# Patient Record
Sex: Male | Born: 1955 | Race: White | Hispanic: No | Smoking: Current some day smoker
Health system: Southern US, Community
[De-identification: ages and names within clinical notes are randomized; demographics above are authoritative.]

## PROBLEM LIST (undated history)

## (undated) DIAGNOSIS — Z973 Presence of spectacles and contact lenses: Secondary | ICD-10-CM

## (undated) DIAGNOSIS — B001 Herpesviral vesicular dermatitis: Secondary | ICD-10-CM

## (undated) DIAGNOSIS — N529 Male erectile dysfunction, unspecified: Secondary | ICD-10-CM

## (undated) DIAGNOSIS — C61 Malignant neoplasm of prostate: Secondary | ICD-10-CM

## (undated) HISTORY — PX: TONSILLECTOMY: SUR1361

## (undated) HISTORY — PX: HIGH INTENSITY FOCUSED ULTRASOUND (HIFU) OF THE PROSTATE: SHX6793

## (undated) HISTORY — PX: OTHER SURGICAL HISTORY: SHX169

---

## 2010-12-12 ENCOUNTER — Encounter: Payer: Self-pay | Admitting: Student

## 2010-12-12 ENCOUNTER — Emergency Department (HOSPITAL_BASED_OUTPATIENT_CLINIC_OR_DEPARTMENT_OTHER)
Admission: EM | Admit: 2010-12-12 | Discharge: 2010-12-12 | Disposition: A | Discharge status: Home / Self Care | Payer: 59 | Attending: Emergency Medicine | Admitting: Emergency Medicine

## 2010-12-12 DIAGNOSIS — J029 Acute pharyngitis, unspecified: Secondary | ICD-10-CM | POA: Insufficient documentation

## 2010-12-12 DIAGNOSIS — F17200 Nicotine dependence, unspecified, uncomplicated: Secondary | ICD-10-CM | POA: Insufficient documentation

## 2010-12-12 LAB — RAPID STREP SCREEN (MED CTR MEBANE ONLY): Streptococcus, Group A Screen (Direct): NEGATIVE

## 2010-12-12 MED ORDER — AZITHROMYCIN 250 MG PO TABS: 250.0000 mg | ORAL_TABLET | Freq: Every day | ORAL | Status: AC

## 2010-12-12 MED ORDER — MELOXICAM 7.5 MG PO TABS: 7.5000 mg | ORAL_TABLET | Freq: Every day | ORAL | Status: AC

## 2010-12-12 NOTE — ED Provider Notes (Signed)
History     Chief Complaint  Patient presents with  . Sore Throat   HPI Comments: Recent exposure to strep.  Now with sore throat, fever, congestion.  Worse with swallowing.    Patient is a 55 y.o. male presenting with pharyngitis. The history is provided by the patient.  Sore Throat This is a new problem. Pertinent negatives include no chest pain and no shortness of breath.    History reviewed. No pertinent past medical history.  History reviewed. No pertinent past surgical history.  History reviewed. No pertinent family history.  History  Substance Use Topics  . Smoking status: Current Some Day Smoker  . Smokeless tobacco: Never Used  . Alcohol Use: Yes      Review of Systems  Constitutional: Positive for fever. Negative for chills.  HENT:       As above.  Respiratory: Negative for cough and shortness of breath.   Cardiovascular: Negative for chest pain and palpitations.  All other systems reviewed and are negative.    Physical Exam  BP 137/85  Pulse 95  Temp(Src) 99.7 F (37.6 C) (Oral)  Resp 20  Wt 185 lb (83.915 kg)  SpO2 98%  Physical Exam  Constitutional: He appears well-developed and well-nourished. No distress.  HENT:  Head: Normocephalic and atraumatic.  Mouth/Throat: No oropharyngeal exudate.       PO erythematous.  Neck: Normal range of motion. Neck supple.  Cardiovascular: Normal rate and regular rhythm.  Exam reveals no gallop and no friction rub.   No murmur heard. Pulmonary/Chest: Effort normal and breath sounds normal. No respiratory distress. He has no wheezes.  Abdominal: Soft.  Lymphadenopathy:    He has no cervical adenopathy.  Skin: He is not diaphoretic.    ED Course  Procedures  MDM Will treat with antibiotics, possibly strep or sinus infection.      Brian Clayton 12/12/10 2250

## 2010-12-12 NOTE — ED Notes (Signed)
Pt in with c/o sore throat and a lot of phlegm in back of throat

## 2010-12-12 NOTE — ED Notes (Signed)
Pt also reports right tennis elbow s/p playing tennis today,

## 2013-12-18 HISTORY — PX: COLONOSCOPY: SHX174

## 2015-03-30 ENCOUNTER — Ambulatory Visit (INDEPENDENT_AMBULATORY_CARE_PROVIDER_SITE_OTHER): Payer: Self-pay | Admitting: Family Medicine

## 2015-03-30 VITALS — BP 140/80 | HR 56 | Temp 98.3°F | Resp 18 | Ht 73.0 in | Wt 197.2 lb

## 2015-03-30 DIAGNOSIS — Z021 Encounter for pre-employment examination: Secondary | ICD-10-CM

## 2015-03-30 DIAGNOSIS — Z0289 Encounter for other administrative examinations: Secondary | ICD-10-CM

## 2015-03-30 NOTE — Progress Notes (Signed)
      Chief Complaint:  Chief Complaint  Patient presents with  . Annual Exam    DOT    HPI: Brian Clayton is a 59 y.o. male who reports to [facility] today complaining of DOT PE  He has no complaints He denies any  history of HTN/MI/OSA/DM; denies CP/SOB/palpitations He denies having SI/HI/hallucinations He smokes some He has peformance anxiety so take propranolol prn    History reviewed. No pertinent past medical history. Past Surgical History  Procedure Laterality Date  . Tonsillectomy     Social History   Social History  . Marital Status: Married    Spouse Name: N/A  . Number of Children: N/A  . Years of Education: N/A   Social History Main Topics  . Smoking status: Current Some Day Smoker  . Smokeless tobacco: Never Used  . Alcohol Use: Yes  . Drug Use: No  . Sexual Activity: Not Asked   Other Topics Concern  . None   Social History Narrative   History reviewed. No pertinent family history. No Known Allergies Prior to Admission medications   Medication Sig Start Date End Date Taking? Authorizing Provider  propranolol (INDERAL) 10 MG tablet Take 10 mg by mouth 3 (three) times daily.   Yes Historical Provider, MD     ROS: The patient denies fevers, chills, night sweats, unintentional weight loss, chest pain, palpitations, wheezing, dyspnea on exertion, nausea, vomiting, abdominal pain, dysuria, hematuria, melena, numbness, weakness, or tingling.   All other systems have been reviewed and were otherwise negative with the exception of those mentioned in the HPI and as above.    PHYSICAL EXAM: Filed Vitals:   03/30/15 1545  BP: 140/80  Pulse: 56  Temp: 98.3 F (36.8 C)  Resp: 18   Body mass index is 26.02 kg/(m^2).   General: Alert, no acute distress HEENT:  Normocephalic, atraumatic, oropharynx patent. EOMI, PERRLA, fundo nl Cardiovascular:  Regular rate and rhythm, no rubs murmurs or gallops.  No Carotid bruits, radial pulse intact. No pedal edema.    Respiratory: Clear to auscultation bilaterally.  No wheezes, rales, or rhonchi.  No cyanosis, no use of accessory musculature Abdominal: No organomegaly, abdomen is soft and non-tender, positive bowel sounds. No masses. Skin: No rashes. Neurologic: Facial musculature symmetric. Psychiatric: Patient acts appropriately throughout our interaction. Lymphatic: No cervical or submandibular lymphadenopathy Musculoskeletal: Gait intact. No edema, tenderness 5/5 strength   LABS: Results for orders placed or performed during the hospital encounter of 12/12/10  Rapid strep screen  Result Value Ref Range   Streptococcus, Group A Screen (Direct) NEGATIVE NEGATIVE     EKG/XRAY:   Primary read interpreted by Dr. Marin Comment at [facility].   ASSESSMENT/PLAN: Encounter Diagnosis  Name Primary?  . 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 Patient is aware that all medications have potential sideeffects and we are unable to predict every sideeffect or drug-drug interaction that may occur.    DO  03/30/2015 4:38 PM

## 2015-10-02 DIAGNOSIS — Z0000 Encounter for general adult medical examination without abnormal findings: Secondary | ICD-10-CM | POA: Diagnosis not present

## 2015-10-02 DIAGNOSIS — Z13220 Encounter for screening for lipoid disorders: Secondary | ICD-10-CM | POA: Diagnosis not present

## 2015-10-02 DIAGNOSIS — Z125 Encounter for screening for malignant neoplasm of prostate: Secondary | ICD-10-CM | POA: Diagnosis not present

## 2015-11-24 DIAGNOSIS — J069 Acute upper respiratory infection, unspecified: Secondary | ICD-10-CM | POA: Diagnosis not present

## 2015-11-29 DIAGNOSIS — R972 Elevated prostate specific antigen [PSA]: Secondary | ICD-10-CM | POA: Diagnosis not present

## 2015-12-11 DIAGNOSIS — C61 Malignant neoplasm of prostate: Secondary | ICD-10-CM | POA: Diagnosis not present

## 2015-12-11 DIAGNOSIS — R972 Elevated prostate specific antigen [PSA]: Secondary | ICD-10-CM | POA: Diagnosis not present

## 2015-12-11 HISTORY — PX: PROSTATE BIOPSY: SHX241

## 2015-12-20 DIAGNOSIS — R972 Elevated prostate specific antigen [PSA]: Secondary | ICD-10-CM | POA: Diagnosis not present

## 2015-12-20 DIAGNOSIS — C61 Malignant neoplasm of prostate: Secondary | ICD-10-CM | POA: Diagnosis not present

## 2016-01-02 DIAGNOSIS — C61 Malignant neoplasm of prostate: Secondary | ICD-10-CM | POA: Diagnosis not present

## 2016-01-11 NOTE — Progress Notes (Signed)
GU Location of Tumor / Histology: prostatic adenocarcinoma  If Prostate Cancer, Gleason Score is (3 + 4=7) and PSA is (2.96)  Juel Burrow noted his PSA was rising during an annual physical two years ago.  Biopsies of prostate (if applicable) revealed:    Past/Anticipated interventions by urology, if any: prostate biopsy, referral to Dr. Tammi Klippel  Past/Anticipated interventions by medical oncology, if any: no  Weight changes, if any: no  Bowel/Bladder complaints, if any: IPSS 1 reports incomplete emptying less than 1 in 5 times   Nausea/Vomiting, if any: no  Pain issues, if any:  no  SAFETY ISSUES:  Prior radiation? no  Pacemaker/ICD? no  Possible current pregnancy? no  Is the patient on methotrexate? 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 Erection dysfunction: takes Viagra or Cialis with success. Prostate volume 39 cc. Father-leukemia, sister - breast, and maternal uncle - prostate

## 2016-01-12 ENCOUNTER — Ambulatory Visit: Payer: Self-pay | Admitting: Radiation Oncology

## 2016-01-15 ENCOUNTER — Ambulatory Visit
Admission: RE | Admit: 2016-01-15 | Discharge: 2016-01-15 | Disposition: A | Discharge status: Home / Self Care | Payer: BLUE CROSS/BLUE SHIELD | Source: Ambulatory Visit | Attending: Radiation Oncology | Admitting: Radiation Oncology

## 2016-01-15 ENCOUNTER — Encounter: Payer: Self-pay | Admitting: Medical Oncology

## 2016-01-15 ENCOUNTER — Encounter: Payer: Self-pay | Admitting: Radiation Oncology

## 2016-01-15 DIAGNOSIS — Z7982 Long term (current) use of aspirin: Secondary | ICD-10-CM | POA: Insufficient documentation

## 2016-01-15 DIAGNOSIS — Z9889 Other specified postprocedural states: Secondary | ICD-10-CM | POA: Diagnosis not present

## 2016-01-15 DIAGNOSIS — Z809 Family history of malignant neoplasm, unspecified: Secondary | ICD-10-CM | POA: Diagnosis not present

## 2016-01-15 DIAGNOSIS — Z79899 Other long term (current) drug therapy: Secondary | ICD-10-CM | POA: Diagnosis not present

## 2016-01-15 DIAGNOSIS — Z5189 Encounter for other specified aftercare: Secondary | ICD-10-CM | POA: Diagnosis not present

## 2016-01-15 DIAGNOSIS — C61 Malignant neoplasm of prostate: Secondary | ICD-10-CM | POA: Insufficient documentation

## 2016-01-15 HISTORY — DX: Malignant neoplasm of prostate: C61

## 2016-01-15 NOTE — Progress Notes (Signed)
See progress note under physician encounter. 

## 2016-01-15 NOTE — Progress Notes (Signed)
Radiation Oncology         [phone]) [phone] ________________________________  Initial outpatient Consultation  Name: Brian Clayton MRN: [id number]  Date: 01/15/2016  DOB: 10-01-1955  CC:No primary care provider on file.  [name], MD   REFERRING PHYSICIAN: [name], MD  DIAGNOSIS: 60 y.o. 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 Malignant neoplasm of prostate ([name]) [name] is a 60 y.o. gentleman.  He was noted to have an elevated PSA of 3.77 by his primary care physician, Dr. Myriam Jacobson.  Accordingly, he was referred for evaluation in urology by Dr. Jeffie Pollock on 11/29/15,  digital rectal examination was performed at that time revealing prostate +2 size and no palpable nodules.  The patient proceeded to transrectal ultrasound with 12 biopsies of the prostate on 12/11/15.  The prostate volume measured 39 cc.  Out of 12 core biopsies, 5 were positive.  The maximum Gleason score was 3+4, and this was seen in left base lateral.  The patient reviewed the biopsy results with his urologist and he has kindly been referred today for discussion of potential radiation treatment options.  PREVIOUS RADIATION THERAPY: No  PAST MEDICAL HISTORY:  has a past medical history of Prostate cancer ([name]).    PAST SURGICAL HISTORY: Past Surgical History:  Procedure Laterality Date  . COLONOSCOPY  12/2013  . PROSTATE BIOPSY    . TONSILLECTOMY      FAMILY HISTORY: family history includes Cancer in his father, maternal uncle, and sister.  SOCIAL HISTORY:  reports that he has been smoking Cigars.  He has never used smokeless tobacco. He reports that he drinks alcohol. He reports that he does not use drugs.  ALLERGIES: Review of patient's allergies indicates no known allergies.  MEDICATIONS:  Current Outpatient Prescriptions  Medication Sig Dispense Refill  . aspirin  81 MG chewable tablet Chew by mouth daily.    . propranolol (INDERAL) 10 MG tablet Take 10 mg by mouth 2 (two) times daily as needed.     . sildenafil (VIAGRA) 25 MG tablet Take 25 mg by mouth daily as needed for erectile dysfunction.    . tadalafil (CIALIS) 10 MG tablet Take 10 mg by mouth daily as needed for erectile dysfunction.     No current facility-administered medications for this encounter.     REVIEW OF SYSTEMS:  A 15 point review of systems is documented in the electronic medical record. This was obtained by the nursing staff. However, I reviewed this with the patient to discuss relevant findings and make appropriate changes.  Pertinent items are noted in HPI..  The patient completed an IPSS and IIEF questionnaire.  His IPSS score was 1 indicating mild urinary outflow obstructive symptoms. He indicated some incomplete emptying at a 1/5.  He indicated that his erectile function is able to complete sexual activity most of the time. He denies weight changes. He denies nausea and vomiting. He denies pain. Patient takes Viagra or Cialis for erectile dysfunction.   PHYSICAL EXAM: This patient is in no acute distress.  He is alert and oriented.   height is 5\' 11"  (1.803 m) and weight is 194 lb (88 kg). His blood pressure is 143/77 (abnormal) and his pulse is 53 (abnormal). His respiration is 16 and oxygen saturation is 100%.  He exhibits no respiratory distress or labored  breathing.  He appears neurologically intact.  His mood is pleasant.  His affect is appropriate.  Please note the digital rectal exam findings described above.  KPS = 100  100 - Normal; no complaints; no evidence of disease. 90   - Able to carry on normal activity; minor signs or symptoms of disease. 80   - Normal activity with effort; some signs or symptoms of disease. 23   - Cares for self; unable to carry on normal activity or to do active work. 60   - Requires occasional assistance, but is able to care for most of his  personal needs. 50   - Requires considerable assistance and frequent medical care. 75   - Disabled; requires special care and assistance. 27   - Severely disabled; hospital admission is indicated although death not imminent. 84   - Very sick; hospital admission necessary; active supportive treatment necessary. 10   - Moribund; fatal processes progressing rapidly. 0     - Dead  Karnofsky DA, Abelmann WH, Craver LS and Burchenal JH [phone]) The use of the nitrogen mustards in the palliative treatment of carcinoma: with particular reference to bronchogenic carcinoma Cancer 1 [id number]   LABORATORY DATA:  No results found for: WBC, HGB, HCT, MCV, PLT No results found for: NA, K, CL, CO2 No results found for: ALT, AST, GGT, ALKPHOS, BILITOT   RADIOGRAPHY: No results found.    IMPRESSION: This gentleman is a 60 year old male with stage T1c N0N0 adenocarcinoma of the prostate with a Gleason's score of 3+4 and a PSA of 3.77.  His T-Stage, Gleason's Score, and PSA put him into the intermediate risk group.  Accordingly he is eligible for a variety of potential treatment options including prostatectomy, external radiation, and prostate seed implant.  PLAN: Today I reviewed the findings and workup thus far.  We discussed the natural history of prostate cancer.  We reviewed the the implications of T-stage, Gleason's Score, and PSA on decision-making and outcomes in prostate cancer.  We discussed radiation treatment in the management of prostate cancer with regard to the logistics and delivery of external beam radiation treatment as well as the logistics and delivery of prostate brachytherapy.  We compared and contrasted each of these approaches and also compared these against prostatectomy.  The patient expressed interest in prostate brachytherapy.   The patient is undecided if he would like to potentially proceed with prostate brachytherapy, although he has not finalized his decision.  I will share my findings  with Dr. Jeffie Pollock and the patient will call us back if he decides on scheduling the procedure.     I enjoyed meeting with him today, and will look forward to participating in the care of this very nice gentleman.   I spent time face to face with the patient and more than 50% of that time was spent in counseling and/or coordination of care.   ------------------------------------------------  Sheral Apley. Tammi Klippel, M.D.       This document serves as a record of services personally performed by Tyler Pita, MD. It was created on his behalf by Bethann Humble, a trained medical scribe. The creation of this record is based on the scribe's personal observations and the provider's statements to them. This document has been checked and approved by the attending provider.

## 2016-01-16 NOTE — Addendum Note (Signed)
Encounter addended by: Heywood Footman, RN on: 01/16/2016 11:17 AM<BR>    Actions taken: Charge Capture section accepted

## 2016-01-23 DIAGNOSIS — C61 Malignant neoplasm of prostate: Secondary | ICD-10-CM | POA: Diagnosis not present

## 2016-02-01 DIAGNOSIS — B001 Herpesviral vesicular dermatitis: Secondary | ICD-10-CM | POA: Diagnosis not present

## 2016-02-01 DIAGNOSIS — Z23 Encounter for immunization: Secondary | ICD-10-CM | POA: Diagnosis not present

## 2016-02-01 DIAGNOSIS — Z01818 Encounter for other preprocedural examination: Secondary | ICD-10-CM | POA: Diagnosis not present

## 2016-02-01 DIAGNOSIS — F40240 Claustrophobia: Secondary | ICD-10-CM | POA: Diagnosis not present

## 2016-02-01 DIAGNOSIS — C61 Malignant neoplasm of prostate: Secondary | ICD-10-CM | POA: Diagnosis not present

## 2016-02-21 DIAGNOSIS — C61 Malignant neoplasm of prostate: Secondary | ICD-10-CM | POA: Diagnosis not present

## 2016-02-27 DIAGNOSIS — C61 Malignant neoplasm of prostate: Secondary | ICD-10-CM | POA: Diagnosis not present

## 2016-03-21 DIAGNOSIS — C61 Malignant neoplasm of prostate: Secondary | ICD-10-CM | POA: Diagnosis not present

## 2016-03-26 ENCOUNTER — Telehealth: Payer: Self-pay | Admitting: Medical Oncology

## 2016-03-26 NOTE — Telephone Encounter (Signed)
Brian Clayton called back stating he is going to have HIFU treatment in [place], [place] for his prostate cancer.

## 2016-03-26 NOTE — Progress Notes (Signed)
Left a message asking for a return call. Following up on consult with Dr. Tammi Klippel to see if he has made a treatment choice.

## 2016-03-26 NOTE — Telephone Encounter (Signed)
Opened in error

## 2016-04-01 DIAGNOSIS — C61 Malignant neoplasm of prostate: Secondary | ICD-10-CM | POA: Diagnosis not present

## 2016-04-04 DIAGNOSIS — C61 Malignant neoplasm of prostate: Secondary | ICD-10-CM | POA: Diagnosis not present

## 2016-04-15 DIAGNOSIS — C61 Malignant neoplasm of prostate: Secondary | ICD-10-CM | POA: Diagnosis not present

## 2016-04-29 DIAGNOSIS — C61 Malignant neoplasm of prostate: Secondary | ICD-10-CM | POA: Diagnosis not present

## 2016-06-18 DIAGNOSIS — C61 Malignant neoplasm of prostate: Secondary | ICD-10-CM | POA: Diagnosis not present

## 2016-06-25 DIAGNOSIS — C61 Malignant neoplasm of prostate: Secondary | ICD-10-CM | POA: Diagnosis not present

## 2016-10-22 DIAGNOSIS — B001 Herpesviral vesicular dermatitis: Secondary | ICD-10-CM | POA: Diagnosis not present

## 2016-10-22 DIAGNOSIS — L259 Unspecified contact dermatitis, unspecified cause: Secondary | ICD-10-CM | POA: Diagnosis not present

## 2016-10-22 DIAGNOSIS — Z13220 Encounter for screening for lipoid disorders: Secondary | ICD-10-CM | POA: Diagnosis not present

## 2016-10-22 DIAGNOSIS — Z8546 Personal history of malignant neoplasm of prostate: Secondary | ICD-10-CM | POA: Diagnosis not present

## 2016-10-22 DIAGNOSIS — Z0000 Encounter for general adult medical examination without abnormal findings: Secondary | ICD-10-CM | POA: Diagnosis not present

## 2016-12-31 DIAGNOSIS — C61 Malignant neoplasm of prostate: Secondary | ICD-10-CM | POA: Diagnosis not present

## 2017-02-05 DIAGNOSIS — H5213 Myopia, bilateral: Secondary | ICD-10-CM | POA: Diagnosis not present

## 2017-02-26 DIAGNOSIS — C61 Malignant neoplasm of prostate: Secondary | ICD-10-CM | POA: Diagnosis not present

## 2017-06-10 DIAGNOSIS — Z8546 Personal history of malignant neoplasm of prostate: Secondary | ICD-10-CM | POA: Diagnosis not present

## 2017-06-30 DIAGNOSIS — C61 Malignant neoplasm of prostate: Secondary | ICD-10-CM | POA: Diagnosis not present

## 2017-07-29 DIAGNOSIS — C61 Malignant neoplasm of prostate: Secondary | ICD-10-CM | POA: Diagnosis not present

## 2017-08-07 DIAGNOSIS — C61 Malignant neoplasm of prostate: Secondary | ICD-10-CM | POA: Diagnosis not present

## 2017-08-11 ENCOUNTER — Ambulatory Visit
Admission: RE | Admit: 2017-08-11 | Discharge: 2017-08-11 | Disposition: A | Discharge status: Home / Self Care | Payer: Self-pay | Source: Ambulatory Visit | Attending: Radiation Oncology | Admitting: Radiation Oncology

## 2017-08-11 ENCOUNTER — Other Ambulatory Visit: Payer: Self-pay | Admitting: Radiation Oncology

## 2017-08-11 DIAGNOSIS — C61 Malignant neoplasm of prostate: Secondary | ICD-10-CM

## 2017-08-13 NOTE — Progress Notes (Signed)
GU Location of Tumor / Histology: prostatic adenocarcinoma  If Prostate Cancer, Gleason Score is (3 + 4=7) and PSA is (2.96) PSA 5.98  06-30-17  Juel Burrow noted his PSA was rising during an annual physical two years ago.  Biopsies of prostate (if applicable) revealed:    Past/Anticipated interventions by urology, if any: prostate biopsy, referral to Dr. Tammi Klippel  MRI Pelvis w wo contrast 07-29-17  PI-RADS 4 lesion within the left mid peripheral zone of the prostate.   Past/Anticipated interventions by medical oncology, if any: no  Weight changes, if any: No Wt Readings from Last 3 Encounters:  08/14/17 188 lb 3.2 oz (85.4 kg)  01/15/16 194 lb (88 kg)  03/30/15 197 lb 3.2 oz (89.4 kg)    Bowel/Bladder complaints, if any: IPSS  1 Reports having occasional weak urinary stream  Nausea/Vomiting, if any: No  Pain issues, if any: No  SAFETY ISSUES:  Prior radiation? no  Pacemaker/ICD? no  Possible current pregnancy? no  Is the patient on methotrexate? 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 Erection dysfunction: takes Viagra or Cialis with success. Prostate volume 39 cc. Father-leukemia, sister - breast, and maternal uncle - prostate          BP (!) 158/86 (BP Location: Right Arm, Patient Position: Sitting, Cuff Size: Normal)   Pulse 66   Temp 98.3 F (36.8 C) (Oral)   Resp 20   Ht 5\' 11"  (1.803 m)   Wt 188 lb 3.2 oz (85.4 kg)   SpO2 98%   BMI 26.25 kg/m

## 2017-08-13 NOTE — Progress Notes (Deleted)
GU Location of Tumor / Histology: prostatic adenocarcinoma  If Prostate Cancer, Gleason Score is (3 + 4=7) and PSA is (2.96)  Brian Clayton noted his PSA was rising during an annual physical two years ago.  Biopsies of prostate (if applicable) revealed:    Past/Anticipated interventions by urology, if any: prostate biopsy, referral to Dr. Tammi Klippel  Past/Anticipated interventions by medical oncology, if any: no  Weight changes, if any: no  Bowel/Bladder complaints, if any: IPSS  reports incomplete emptying less than 1 in 5 times   Nausea/Vomiting, if any:   Pain issues, if any:    SAFETY ISSUES:  Prior radiation? no  Pacemaker/ICD? no  Possible current pregnancy? no  Is the patient on methotrexate? 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 Erection dysfunction: takes Viagra or Cialis with success. Prostate volume 39 cc. Father-leukemia, sister - breast, and maternal uncle - prostate

## 2017-08-14 ENCOUNTER — Ambulatory Visit
Admission: RE | Admit: 2017-08-14 | Discharge: 2017-08-14 | Disposition: A | Discharge status: Home / Self Care | Payer: BLUE CROSS/BLUE SHIELD | Source: Ambulatory Visit | Attending: Radiation Oncology | Admitting: Radiation Oncology

## 2017-08-14 ENCOUNTER — Encounter: Payer: Self-pay | Admitting: Radiation Oncology

## 2017-08-14 ENCOUNTER — Encounter: Payer: Self-pay | Admitting: Medical Oncology

## 2017-08-14 ENCOUNTER — Other Ambulatory Visit: Payer: Self-pay

## 2017-08-14 VITALS — BP 158/86 | HR 66 | Temp 98.3°F | Resp 20 | Ht 71.0 in | Wt 188.2 lb

## 2017-08-14 DIAGNOSIS — C61 Malignant neoplasm of prostate: Secondary | ICD-10-CM

## 2017-08-14 DIAGNOSIS — Z809 Family history of malignant neoplasm, unspecified: Secondary | ICD-10-CM | POA: Insufficient documentation

## 2017-08-14 DIAGNOSIS — R9721 Rising PSA following treatment for malignant neoplasm of prostate: Secondary | ICD-10-CM | POA: Diagnosis not present

## 2017-08-14 DIAGNOSIS — Z9289 Personal history of other medical treatment: Secondary | ICD-10-CM | POA: Diagnosis not present

## 2017-08-14 DIAGNOSIS — F17290 Nicotine dependence, other tobacco product, uncomplicated: Secondary | ICD-10-CM | POA: Insufficient documentation

## 2017-08-14 DIAGNOSIS — Z79899 Other long term (current) drug therapy: Secondary | ICD-10-CM | POA: Insufficient documentation

## 2017-08-14 DIAGNOSIS — Z7982 Long term (current) use of aspirin: Secondary | ICD-10-CM | POA: Diagnosis not present

## 2017-08-14 NOTE — Progress Notes (Signed)
Radiation Oncology         [phone] ________________________________  Initial Outpatient Consultation  Name: Brian Clayton MRN: [id number]  Date: 08/14/2017  DOB: 04-07-1956  SR:PRXY, Brian Luo, MD  [name], MD   REFERRING PHYSICIAN: [name], MD  DIAGNOSIS: 62 y.o. 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 Malignant neoplasm of prostate ([place]) [name] is a 62 y.o. gentleman.  He was initially noted to have an elevated PSA of 3.77 by his primary care physician, Dr. Myriam Jacobson in 2017.  Accordingly, he was referred for evaluation in urology by Dr. Jeffie Pollock on 11/29/15,  digital rectal examination was performed at that time revealing prostate +2 size and no palpable nodules.  The patient proceeded to transrectal ultrasound with 12 biopsies of the prostate on 12/11/15.  The prostate volume measured 39 cc.  Out of 12 core biopsies, 5 were positive.  The maximum Gleason score was 3+4, and this was seen in left base lateral.  We met the patient in consult on 01/15/16 to discuss the role of radiotehrapy but the patient elected at that time to proceed with HiFU.  HiFU was performed in 03/2016 with Dr. Lewie Loron in [place], [place].  Initial post treatment PSA was 0.75 on 06/18/16 but has been steadily rising since that time, with most recent PSA of 5.98 on 06/30/17.  PSA trend: 10/31/16 2.24 12/31/16 2.96 02/26/17 2.93 06/10/17 3.86 06/30/17 5.98  He had a prostate MRI at [facility] on 07/29/17 which showed a PIRADs 4 lesion in the mid peripheral zone with a prostate volume of 34 mL.  Recommendation was to proceed with MRI fusion biopsy but this has not been scheduled to date.  He has discussed the potential for repeat HiFU procedure versus proceeding with radiotherapy and has been informed by Dr. Lewie Loron that  repeat HiFU  would remain a viable option after salvage radiotherapy, should he choose.  He reviewed the post-treatment PSA results with his urologist and he has kindly been referred today for discussion of potential salvage radiation treatment options.  PREVIOUS RADIATION THERAPY: No  PAST MEDICAL HISTORY:  has a past medical history of Prostate cancer ([place]).    PAST SURGICAL HISTORY: Past Surgical History:  Procedure Laterality Date  . COLONOSCOPY  12/2013  . PROSTATE BIOPSY    . TONSILLECTOMY      FAMILY HISTORY: family history includes Cancer in his father, maternal uncle, and sister.  SOCIAL HISTORY:  reports that he has been smoking cigars.  He has never used smokeless tobacco. He reports that he drinks alcohol. He reports that he does not use drugs.  ALLERGIES: Patient has no known allergies.  MEDICATIONS:  Current Outpatient Medications  Medication Sig Dispense Refill  . aspirin 81 MG chewable tablet Chew by mouth daily.    . propranolol (INDERAL) 10 MG tablet Take 10 mg by mouth 2 (two) times daily as needed.     . sildenafil (VIAGRA) 25 MG tablet Take 25 mg by mouth daily as needed for erectile dysfunction.    . tadalafil (CIALIS) 10 MG tablet Take 10 mg by mouth daily as needed for erectile dysfunction.     No current facility-administered medications for this encounter.     REVIEW OF SYSTEMS:  On review of  systems, the patient reports that he is doing well overall. He denies any chest pain, shortness of breath, cough, fevers, chills, night sweats, unintended weight changes. He denies any bowel or bladder disturbances, and denies abdominal pain, nausea or vomiting. He denies any new musculoskeletal or joint aches or pains, new skin lesions or concerns. His IPSS was 1 indicating mild urinary symptoms. He is able to complete sexual activity with most attempts. A complete review of systems is obtained and is otherwise negative.   Vitals:   08/14/17 1306  BP: (!) 158/86    Pulse: 66  Resp: 20  Temp: 98.3 F (36.8 C)  SpO2: 98%     PHYSICAL EXAM: In general this is a well appearing caucasian gentleman in no acute distress. He is alert and oriented x4 and appropriate throughout the examination. HEENT reveals that the patient is normocephalic, atraumatic. EOMs are intact. PERRLA. Skin is intact without any evidence of gross lesions. Cardiovascular exam reveals a regular rate and rhythm, no clicks rubs or murmurs are auscultated. Chest is clear to auscultation bilaterally. Lymphatic assessment is performed and does not reveal any adenopathy in the cervical, supraclavicular, axillary, or inguinal chains. Abdomen has active bowel sounds in all quadrants and is intact. The abdomen is soft, non tender, non distended. Lower extremities are negative for pretibial pitting edema, deep calf tenderness, cyanosis or clubbing.   KPS = 100  100 - Normal; no complaints; no evidence of disease. 90   - Able to carry on normal activity; minor signs or symptoms of disease. 80   - Normal activity with effort; some signs or symptoms of disease. 9   - Cares for self; unable to carry on normal activity or to do active work. 60   - Requires occasional assistance, but is able to care for most of his personal needs. 50   - Requires considerable assistance and frequent medical care. 51   - Disabled; requires special care and assistance. 64   - Severely disabled; hospital admission is indicated although death not imminent. 62   - Very sick; hospital admission necessary; active supportive treatment necessary. 10   - Moribund; fatal processes progressing rapidly. 0     - Dead  Karnofsky DA, Abelmann WH, Craver LS and Burchenal JH [phone]) The use of the nitrogen mustards in the palliative treatment of carcinoma: with particular reference to bronchogenic carcinoma Cancer 1 634-56   LABORATORY DATA:  No results found for: WBC, HGB, HCT, MCV, PLT No results found for: NA, K, CL, CO2 No  results found for: ALT, AST, GGT, ALKPHOS, BILITOT   RADIOGRAPHY: No results found.    IMPRESSION: This gentleman is a 62 year old male with a history of stage T1c N0, M0 adenocarcinoma of the prostate with a Gleason's score of 3+4 and a PSA of 3.77, now with rising PSA (5.98) s/p primary treatment with HiFU in 03/2016.  Additionally, there is evidence of residual disease on recent MRI prostate with a PIRADs 4 lesion in the mid peripheral zone.  PLAN: Today we reviewed the findings and workup thus far. We discussed the implications of rising PSA following primary treatment with HiFU as well as indication of residual disease in the prostate on recent MRI. We discussed the importance of proceeding with MRI fusion TRUSPBx for updated tissue pathology prior to making our final treatment recommendations as his options may possibly change based on an updated Gleason score.  We discussed salvage radiation treatment in the management of prostate cancer  with regard to the logistics and delivery of external beam radiation treatment as well as the logistics and delivery of prostate brachytherapy. We compared and contrasted each of these approaches and also compared these against prostatectomy.  He may not be an ideal candidate for prostate brachytherapy following HiFU but we are willing to reach out to some of his colleagues to ascertain whether this is indeed a logistically viable, effective option following prostate HiFU.   The patient is currently leaning towards prostate brachytherapy if he indeed remains a candidate for this treatment. He will have a repeat TRUSPBx with MRI fusion with [facility] in April 2019 and is just awaiting a call back with the procedure date and time.  He will request a copy of the final pathology report be sent to our office for review and we will plan to call him with these results to discuss final treatment recommendations at that time.  He is comfortable with this plan.   If brachytherapy is an option, this is his treatment of choice and he prefers to have this performed here in [place], [place] with Dr. Jeffie Pollock and me.  If he opts for prostate IMRT, he would like to have treatments locally as well and would like to have Dr. Jeffie Pollock place the fiducials and SpaceOAR prior to treatment.  We enjoyed meeting with him today, and will look forward to participating in the care of this very nice gentleman should he decide to proceed with radiotherapy.   We spent 60 minutes face to face with the patient and more than 50% of that time was spent in counseling and/or coordination of care.                     Nicholos Johns, PA-C    Tyler Pita, MD  [facility] Direct Dial: [phone]  Fax: [phone] [email or web address]  Skype  LinkedIn   This document serves as a record of services personally performed by Tyler Pita, MD and Freeman Caldron, PA-C. It was created on their behalf by Bethann Humble, a trained medical scribe. The creation of this record is based on the scribe's personal observations and the provider's statements to them. This document has been checked and approved by the attending provider.

## 2017-08-14 NOTE — Progress Notes (Signed)
Brian Clayton consulted with Dr. Tammi Klippel 01/15/16 regarding radiation treatment options. He elected HIFU (04/04/16) as treatment. He states that his PSA has been rising and he is here today to discuss radiation options. Dr. Lewie Loron and Dr. Jeffie Pollock have suggested a repeat biopsy and he does not want to do this because he knows it is cancer. We discussed how his last biopsy was 2 years ago and a repeat biopsy will provide [place] with current staging which could change his treatment. I explained that Ashlyn/Dr. Tammi Klippel will discuss in detail today.He voiced understanding.

## 2017-09-05 ENCOUNTER — Telehealth: Payer: Self-pay | Admitting: Urology

## 2017-09-05 NOTE — Telephone Encounter (Signed)
Patient is scheduled for MRI fusion biopsy on Sep 29, 2017 with Dr. Lewie Loron at [facility].  He then has a follow-up scheduled with Dr. Lewie Loron on Oct 14, 2017 to discuss results and would like to schedule a follow-up with Dr. Tammi Klippel shortly thereafter on Thursday, May 30 to further discuss treatment options at that time.  At this point, he is leaning towards external beam radiotherapy as opposed to brachytherapy.

## 2017-09-08 ENCOUNTER — Telehealth: Payer: Self-pay | Admitting: *Deleted

## 2017-09-08 NOTE — Telephone Encounter (Signed)
CALLED PATIENT TO INFORM OF [facility] APPT. FOR 10-16-17, LVM FOR A RETURN CALL

## 2017-09-24 DIAGNOSIS — C61 Malignant neoplasm of prostate: Secondary | ICD-10-CM | POA: Diagnosis not present

## 2017-09-29 DIAGNOSIS — R972 Elevated prostate specific antigen [PSA]: Secondary | ICD-10-CM | POA: Diagnosis not present

## 2017-09-29 DIAGNOSIS — R9721 Rising PSA following treatment for malignant neoplasm of prostate: Secondary | ICD-10-CM | POA: Diagnosis not present

## 2017-09-29 DIAGNOSIS — C61 Malignant neoplasm of prostate: Secondary | ICD-10-CM | POA: Diagnosis not present

## 2017-10-03 ENCOUNTER — Telehealth: Payer: Self-pay | Admitting: *Deleted

## 2017-10-03 NOTE — Telephone Encounter (Signed)
Returned patient's phone call, lvm 

## 2017-10-08 ENCOUNTER — Encounter: Payer: Self-pay | Admitting: Urology

## 2017-10-08 NOTE — Progress Notes (Signed)
GU Location of Tumor / Histology: prostatic adenocarcinoma  If Prostate Cancer, Gleason Score is (3 + 4) and PSA is (5.98) on 06/30/2017.  Brian Clayton had HIFU in 2017. Repeat biopsy revealed six positive cores.  Biopsies of prostate (if applicable) revealed:     Past/Anticipated interventions by urology, if any: prostate biopsy, HIFU, repeat prostate biopsy, referral to radiation oncology  Past/Anticipated interventions by medical oncology, if any: no  Weight changes, if any: no Wt Readings from Last 3 Encounters:  10/09/17 187 lb 12.8 oz (85.2 kg)  08/14/17 188 lb 3.2 oz (85.4 kg)  01/15/16 194 lb (88 kg)   Bowel/Bladder complaints, if any: No urinary issues, does not get up at night to void.  Having normal bowel movements daily.   Nausea/Vomiting, if any: No  Pain issues, if any:No  SAFETY ISSUES:  Prior radiation? No  Pacemaker/ICD? No  Possible current pregnancy? no  Is the patient on methotrexate? No  Current Complaints / other details:  62 year old male. Divorced. Father with hx of leukemia.  BP 124/75 (BP Location: Right Arm, Patient Position: Sitting, Cuff Size: Normal)   Pulse 60   Temp 97.8 F (36.6 C) (Oral)   Resp 16   Ht 5\' 11"  (1.803 m)   Wt 187 lb 12.8 oz (85.2 kg)   SpO2 98%   BMI 26.19 kg/m

## 2017-10-09 ENCOUNTER — Encounter: Payer: Self-pay | Admitting: Urology

## 2017-10-09 ENCOUNTER — Ambulatory Visit
Admission: RE | Admit: 2017-10-09 | Discharge: 2017-10-09 | Disposition: A | Discharge status: Home / Self Care | Payer: BLUE CROSS/BLUE SHIELD | Source: Ambulatory Visit | Attending: Urology | Admitting: Urology

## 2017-10-09 ENCOUNTER — Other Ambulatory Visit: Payer: Self-pay

## 2017-10-09 ENCOUNTER — Ambulatory Visit
Admission: RE | Admit: 2017-10-09 | Discharge: 2017-10-09 | Disposition: A | Discharge status: Home / Self Care | Payer: BLUE CROSS/BLUE SHIELD | Source: Ambulatory Visit | Attending: Radiation Oncology | Admitting: Radiation Oncology

## 2017-10-09 VITALS — BP 124/75 | HR 60 | Temp 97.8°F | Resp 16 | Ht 71.0 in | Wt 187.8 lb

## 2017-10-09 DIAGNOSIS — Z809 Family history of malignant neoplasm, unspecified: Secondary | ICD-10-CM | POA: Diagnosis not present

## 2017-10-09 DIAGNOSIS — F17210 Nicotine dependence, cigarettes, uncomplicated: Secondary | ICD-10-CM | POA: Insufficient documentation

## 2017-10-09 DIAGNOSIS — Z7982 Long term (current) use of aspirin: Secondary | ICD-10-CM | POA: Insufficient documentation

## 2017-10-09 DIAGNOSIS — R972 Elevated prostate specific antigen [PSA]: Secondary | ICD-10-CM | POA: Diagnosis not present

## 2017-10-09 DIAGNOSIS — F17290 Nicotine dependence, other tobacco product, uncomplicated: Secondary | ICD-10-CM | POA: Diagnosis not present

## 2017-10-09 DIAGNOSIS — C61 Malignant neoplasm of prostate: Secondary | ICD-10-CM | POA: Diagnosis not present

## 2017-10-09 DIAGNOSIS — Z79899 Other long term (current) drug therapy: Secondary | ICD-10-CM | POA: Insufficient documentation

## 2017-10-09 DIAGNOSIS — Z9289 Personal history of other medical treatment: Secondary | ICD-10-CM | POA: Diagnosis not present

## 2017-10-09 NOTE — Progress Notes (Signed)
Radiation Oncology         (336) [phone] ________________________________  Follow up New Outpatient office visit  Name: Brian Clayton MRN: [id number]  Date: 10/09/2017  DOB: 1955-07-25  CN:OBSJ, Dwyane Luo, MD  [name], MD   REFERRING PHYSICIAN: [name], MD  DIAGNOSIS: 62 y.o. gentleman with residual/recurrent stage T1c, Gleason's 3+4 adenocarcinoma of the prostate with a PSA of 5.98, s/p primary treatment with HiFU in 03/2016.       ICD-10-CM   1. Malignant neoplasm of prostate ([place]) C61     HISTORY OF PRESENT ILLNESS::Brian Clayton is a 62 y.o. gentleman.  He was initially noted to have an elevated PSA of 3.77 by his primary care physician, Dr. Myriam Jacobson in 2017.  Accordingly, he was referred for evaluation in urology by Dr. Jeffie Pollock on 11/29/15,  digital rectal examination was performed at that time revealing prostate +2 size and no palpable nodules.  The patient proceeded to transrectal ultrasound with 12 biopsies of the prostate on 12/11/15.  The prostate volume measured 39 cc.  Out of 12 core biopsies, 5 were positive.  The maximum Gleason score was 3+4, and this was seen in left base lateral.  We met the patient in consult on 01/15/16 to discuss the role of radiotehrapy but the patient elected at that time to proceed with HiFU.  HiFU was performed in 03/2016 with Dr. Lewie Loron in [place], [place].  Initial post treatment PSA was 0.75 on 06/18/16 but has been steadily rising since that time, with most recent PSA of 5.98 on 06/30/17.  PSA trend: 10/31/16 2.24 12/31/16 2.96 02/26/17 2.93 06/10/17 3.86 06/30/17 5.98  He had a prostate MRI at [facility] on 07/29/17 which showed a PIRADs 4 lesion in the mid peripheral zone with a prostate volume of 34 mL.  There was no evidence of extraprostatic extension, seminal vesicle involvement, neurovascular bundle involvement or enlarged pelvic lymph nodes.  Recommendation was to proceed with MRI fusion biopsy which was performed by Dr. Netta Corrigan on 09/29/17 confirmed  recurrent Gleason 3+4 disease in the right apex, left lateral base and left lateral mid gland.  Additionally, there was Gleason 3+3 disease in 2 of the 3 region of interest biopsy specimens as well as in the right lateral base specimen.  He has discussed the potential for repeat HiFU procedure versus proceeding with radiotherapy and has been informed by Dr. Lewie Loron that repeat HiFU  would remain a viable option after salvage radiotherapy, should he choose.  He reviewed the post-treatment PSA results and recent fusion biopsy results with his urologist and he has kindly been referred today for further discussion of potential salvage radiation treatment options.  PREVIOUS RADIATION THERAPY: No  PAST MEDICAL HISTORY:  has a past medical history of Prostate cancer ([place]).    PAST SURGICAL HISTORY: Past Surgical History:  Procedure Laterality Date  . COLONOSCOPY  12/2013  . PROSTATE BIOPSY    . TONSILLECTOMY      FAMILY HISTORY: family history includes Cancer in his father, maternal uncle, and sister.  SOCIAL HISTORY:  reports that he has been smoking cigars.  He has never used smokeless tobacco. He reports that he drinks alcohol. He reports that he does not use drugs.  ALLERGIES: Patient has no known allergies.  MEDICATIONS:  Current Outpatient Medications  Medication Sig Dispense Refill  . aspirin 81 MG chewable tablet Chew by mouth daily.    . propranolol (INDERAL) 10 MG tablet Take 10 mg by mouth 2 (two) times daily as  needed.     . valACYclovir (VALTREX) 1000 MG tablet Take by mouth.    . sildenafil (VIAGRA) 25 MG tablet Take 25 mg by mouth daily as needed for erectile dysfunction.    . tadalafil (CIALIS) 10 MG tablet Take 10 mg by mouth daily as needed for erectile dysfunction.     No current facility-administered medications for this encounter.     REVIEW OF SYSTEMS:  On review of systems, the patient reports that he is doing well overall. He denies any chest pain, shortness of  breath, cough, fevers, chills, night sweats, unintended weight changes. He denies any bowel or bladder disturbances, and denies abdominal pain, nausea or vomiting. He denies any new musculoskeletal or joint aches or pains, new skin lesions or concerns. His IPSS was 1 indicating mild urinary symptoms. He is able to complete sexual activity with most attempts. A complete review of systems is obtained and is otherwise negative.   Vitals:   10/09/17 0935  BP: 124/75  Pulse: 60  Resp: 16  Temp: 97.8 F (36.6 C)  SpO2: 98%     PHYSICAL EXAM: In general this is a well appearing caucasian gentleman in no acute distress. He is alert and oriented x4 and appropriate throughout the examination. HEENT reveals that the patient is normocephalic, atraumatic. EOMs are intact. PERRLA. Skin is intact without any evidence of gross lesions. Cardiovascular exam reveals a regular rate and rhythm, no clicks rubs or murmurs are auscultated. Chest is clear to auscultation bilaterally. Lymphatic assessment is performed and does not reveal any adenopathy in the cervical, supraclavicular, axillary, or inguinal chains. Abdomen has active bowel sounds in all quadrants and is intact. The abdomen is soft, non tender, non distended. Lower extremities are negative for pretibial pitting edema, deep calf tenderness, cyanosis or clubbing.   KPS = 100  100 - Normal; no complaints; no evidence of disease. 90   - Able to carry on normal activity; minor signs or symptoms of disease. 80   - Normal activity with effort; some signs or symptoms of disease. 37   - Cares for self; unable to carry on normal activity or to do active work. 60   - Requires occasional assistance, but is able to care for most of his personal needs. 50   - Requires considerable assistance and frequent medical care. 2   - Disabled; requires special care and assistance. 7   - Severely disabled; hospital admission is indicated although death not imminent. 28   -  Very sick; hospital admission necessary; active supportive treatment necessary. 10   - Moribund; fatal processes progressing rapidly. 0     - Dead  Karnofsky DA, Abelmann WH, Craver LS and Burchenal JH [phone]) The use of the nitrogen mustards in the palliative treatment of carcinoma: with particular reference to bronchogenic carcinoma Cancer 1 634-56   LABORATORY DATA:  No results found for: WBC, HGB, HCT, MCV, PLT No results found for: NA, K, CL, CO2 No results found for: ALT, AST, GGT, ALKPHOS, BILITOT   RADIOGRAPHY: No results found.    IMPRESSION: This gentleman is a 62 year old male with residual/recurrent stage T1c, Gleason's 3+4 adenocarcinoma of the prostate with a PSA of 5.98, s/p primary treatment with HiFU in 03/2016.    PLAN: Today we reviewed the findings and workup thus far. We discussed salvage radiation treatment in the management of prostate cancer with regard to the logistics and delivery of external beam radiation treatment as well as the logistics and  delivery of prostate brachytherapy. We compared and contrasted each of these approaches and also compared these against prostatectomy.  He is not an ideal candidate for prostate brachytherapy following HiFU and therefore, the recommendation would be to proceed with a 5.5 week course of salvage radiotherapy delivered daily.  We reviewed the short and long-term side effects expected with this treatment as well as the risks and benefits of treatment.  He was encouraged to ask questions that were answered to his stated satisfaction.  At the conclusion of today's conversation, the patient elects to proceed with a 5-1/2-week course of daily radiotherapy and would like to have treatments locally in [place].  He would also prefer to have Dr. Jeffie Pollock place the fiducials and SpaceOAR prior to treatment.  We will move forward with coordinating a follow-up visit with Dr. Jeffie Pollock for fiducials and SpaceOAR prior to his CT planning appointment.   He has 2 upcoming weddings to attend in early June and is therefore hoping to begin his radiotherapy around June 17th or shortly thereafter.  We enjoyed meeting with him today, and will look forward to participating in the care of this very nice gentleman..   More than 50% of today's visit was spent in counseling and/or coordination of care.                     Nicholos Johns, PA-C    Tyler Pita, MD  [facility] Direct Dial: [phone]  Fax: [phone] [email or web address]  Skype  LinkedIn

## 2017-10-13 NOTE — Addendum Note (Signed)
Encounter addended by: Tyler Pita, MD on: 10/13/2017 6:18 PM  Actions taken: Medication List reviewed, Allergies reviewed, Problem List reviewed

## 2017-10-15 ENCOUNTER — Telehealth: Payer: Self-pay | Admitting: Radiation Oncology

## 2017-10-15 NOTE — Telephone Encounter (Signed)
Received message from patient confused about 10/16/2017 1300 appointment. Explained this appointment was entered in error. Deleted appointment from [facility] Unable to delete appointment in [place]. Patient verbalized understanding. Patient reports he hasn't heard from Dr. Ralene Muskrat office about marker placement and SpaceOar. Patient understands our office will reach out to Dr. Ralene Muskrat office to inquire about status then, phone him back.

## 2017-10-15 NOTE — Telephone Encounter (Signed)
Brian Clayton, This is the patient that I messaged you about regarding the need for a follow up visit with Dr. Jeffie Pollock prior to fiducial [facility] procedure.  Have you heard back regarding date/time for follow up visit with Dr. Jeffie Pollock? [name]

## 2017-10-16 ENCOUNTER — Ambulatory Visit: Payer: BLUE CROSS/BLUE SHIELD | Attending: Radiation Oncology

## 2017-10-16 ENCOUNTER — Ambulatory Visit: Payer: Self-pay | Admitting: Urology

## 2017-10-22 ENCOUNTER — Ambulatory Visit: Payer: BLUE CROSS/BLUE SHIELD | Admitting: Urology

## 2017-10-22 ENCOUNTER — Ambulatory Visit: Payer: BLUE CROSS/BLUE SHIELD

## 2017-11-05 DIAGNOSIS — R9721 Rising PSA following treatment for malignant neoplasm of prostate: Secondary | ICD-10-CM | POA: Diagnosis not present

## 2017-11-05 DIAGNOSIS — C61 Malignant neoplasm of prostate: Secondary | ICD-10-CM | POA: Diagnosis not present

## 2017-11-06 ENCOUNTER — Encounter (HOSPITAL_BASED_OUTPATIENT_CLINIC_OR_DEPARTMENT_OTHER): Payer: Self-pay | Admitting: *Deleted

## 2017-11-06 ENCOUNTER — Other Ambulatory Visit: Payer: Self-pay

## 2017-11-06 ENCOUNTER — Telehealth: Payer: Self-pay | Admitting: *Deleted

## 2017-11-06 ENCOUNTER — Other Ambulatory Visit: Payer: Self-pay | Admitting: Urology

## 2017-11-06 NOTE — Telephone Encounter (Signed)
CALLED PATIENT TO INFORM OF FID. MARKER AND SPACE OAR PLACEMENT ON 11-11-17 @ 11 AM @ [facility] ON 11-13-17 @ 9 AM @ DR. MANNING'S OFFICE, SPOKE WITH PATIENT AND HE IS AWARE OF THESE APPTS.

## 2017-11-06 NOTE — Progress Notes (Signed)
Spoke w/ pt via phone pre-op interview.  Npo after mn.  Arrive at 0900.

## 2017-11-06 NOTE — Telephone Encounter (Signed)
XXXXX

## 2017-11-11 ENCOUNTER — Ambulatory Visit (HOSPITAL_BASED_OUTPATIENT_CLINIC_OR_DEPARTMENT_OTHER): Admission: RE | Admit: 2017-11-11 | Payer: BLUE CROSS/BLUE SHIELD | Source: Ambulatory Visit | Admitting: Urology

## 2017-11-11 ENCOUNTER — Other Ambulatory Visit: Payer: Self-pay | Admitting: Urology

## 2017-11-11 DIAGNOSIS — C61 Malignant neoplasm of prostate: Secondary | ICD-10-CM

## 2017-11-11 HISTORY — DX: Herpesviral vesicular dermatitis: B00.1

## 2017-11-11 HISTORY — DX: Presence of spectacles and contact lenses: Z97.3

## 2017-11-11 HISTORY — DX: Male erectile dysfunction, unspecified: N52.9

## 2017-11-11 SURGERY — INJECTION, HYDROGEL SPACER
Anesthesia: General

## 2017-11-13 ENCOUNTER — Ambulatory Visit: Payer: BLUE CROSS/BLUE SHIELD | Admitting: Radiation Oncology

## 2017-12-02 DIAGNOSIS — C61 Malignant neoplasm of prostate: Secondary | ICD-10-CM | POA: Diagnosis not present

## 2017-12-03 ENCOUNTER — Telehealth: Payer: Self-pay | Admitting: *Deleted

## 2017-12-03 DIAGNOSIS — Z13220 Encounter for screening for lipoid disorders: Secondary | ICD-10-CM | POA: Diagnosis not present

## 2017-12-03 DIAGNOSIS — Z8249 Family history of ischemic heart disease and other diseases of the circulatory system: Secondary | ICD-10-CM | POA: Diagnosis not present

## 2017-12-03 DIAGNOSIS — Z0000 Encounter for general adult medical examination without abnormal findings: Secondary | ICD-10-CM | POA: Diagnosis not present

## 2017-12-03 NOTE — Telephone Encounter (Signed)
Called patient to inform of sim appt. and MRI appt. For 12-12-17, lvm for a return call

## 2017-12-04 ENCOUNTER — Telehealth: Payer: Self-pay | Admitting: Medical Oncology

## 2017-12-04 NOTE — Telephone Encounter (Signed)
Spoke with patient as requested by Freeman Caldron, PA-C to discuss the importance of MRI after placement of SpaceOAR. He states he is willing to do the MRI if his insurance will cover the cost. I spoke with [place] and she has obtained authorization from [facility] for the MRI. I explained to patient that is is authorized but we do not know what the cost will be to him. It all depends his deductible and out of pocket. I gave him the number to [place] [phone] to obtain this information. He states he will move forward with MRI since it has been authorized by his insurance. Ashlyn notified.

## 2017-12-10 ENCOUNTER — Telehealth: Payer: Self-pay | Admitting: *Deleted

## 2017-12-10 ENCOUNTER — Other Ambulatory Visit: Payer: Self-pay | Admitting: Urology

## 2017-12-10 MED ORDER — ALPRAZOLAM 0.5 MG PO TABS: 0.5000 mg | ORAL_TABLET | ORAL | 0 refills | Status: DC | PRN

## 2017-12-10 NOTE — Telephone Encounter (Signed)
CALLED PATIENT TO INFORM THAT SCRIPT HAS BEEN SENT TO [facility] ON [address] LVM FOR A RETURN CALL

## 2017-12-11 ENCOUNTER — Ambulatory Visit (HOSPITAL_COMMUNITY)
Admission: RE | Admit: 2017-12-11 | Discharge: 2017-12-11 | Disposition: A | Discharge status: Home / Self Care | Payer: BLUE CROSS/BLUE SHIELD | Source: Ambulatory Visit | Attending: Urology | Admitting: Urology

## 2017-12-11 DIAGNOSIS — C61 Malignant neoplasm of prostate: Secondary | ICD-10-CM | POA: Diagnosis not present

## 2017-12-12 ENCOUNTER — Encounter: Payer: Self-pay | Admitting: Medical Oncology

## 2017-12-12 ENCOUNTER — Ambulatory Visit
Admission: RE | Admit: 2017-12-12 | Discharge: 2017-12-12 | Disposition: A | Discharge status: Home / Self Care | Payer: BLUE CROSS/BLUE SHIELD | Source: Ambulatory Visit | Attending: Radiation Oncology | Admitting: Radiation Oncology

## 2017-12-12 ENCOUNTER — Ambulatory Visit (HOSPITAL_COMMUNITY): Payer: BLUE CROSS/BLUE SHIELD

## 2017-12-12 DIAGNOSIS — C61 Malignant neoplasm of prostate: Secondary | ICD-10-CM | POA: Diagnosis not present

## 2017-12-12 NOTE — Progress Notes (Signed)
  Radiation Oncology         [phone]) [phone] ________________________________  Name: Brian Clayton MRN: [id number]  Date: 12/12/2017  DOB: 1955/12/14  SIMULATION AND TREATMENT PLANNING NOTE    ICD-10-CM   1. Malignant neoplasm of prostate ([place]) C61     DIAGNOSIS:  62 y.o. gentleman with stage T1c N0 N0 adenocarcinoma of the prostate with a Gleason's score of 3+4 and a PSA of 3.77, now with rising PSA (5.98) s/p primary treatment with HiFU in 03/2016  NARRATIVE:  The patient was brought to the [facility].  Identity was confirmed.  All relevant records and images related to the planned course of therapy were reviewed.  The patient freely provided informed written consent to proceed with treatment after reviewing the details related to the planned course of therapy. The consent form was witnessed and verified by the simulation staff.  Then, the patient was set-up in a stable reproducible supine position for radiation therapy.  A vacuum lock pillow device was custom fabricated to position his legs in a reproducible immobilized position.  Then, I performed a urethrogram under sterile conditions to identify the prostatic apex.  CT images were obtained.  Surface markings were placed.  The CT images were loaded into the planning software.  Then the prostate target and avoidance structures including the rectum, bladder, bowel and hips were contoured.  Treatment planning then occurred.  The radiation prescription was entered and confirmed.  A total of one complex treatment devices was fabricated. I have requested : Intensity Modulated Radiotherapy (IMRT) is medically necessary for this case for the following reason:  Rectal sparing.Marland Kitchen  PLAN:  The patient will receive 70 Gy in 28 fractions.  ________________________________  Sheral Apley Tammi Klippel, M.D.

## 2017-12-18 DIAGNOSIS — Z510 Encounter for antineoplastic radiation therapy: Secondary | ICD-10-CM | POA: Insufficient documentation

## 2017-12-18 DIAGNOSIS — C61 Malignant neoplasm of prostate: Secondary | ICD-10-CM | POA: Diagnosis not present

## 2017-12-24 ENCOUNTER — Ambulatory Visit
Admission: RE | Admit: 2017-12-24 | Discharge: 2017-12-24 | Disposition: A | Discharge status: Home / Self Care | Payer: BLUE CROSS/BLUE SHIELD | Source: Ambulatory Visit | Attending: Radiation Oncology | Admitting: Radiation Oncology

## 2017-12-24 ENCOUNTER — Encounter: Payer: Self-pay | Admitting: Medical Oncology

## 2017-12-24 DIAGNOSIS — C61 Malignant neoplasm of prostate: Secondary | ICD-10-CM | POA: Diagnosis not present

## 2017-12-24 DIAGNOSIS — Z510 Encounter for antineoplastic radiation therapy: Secondary | ICD-10-CM | POA: Diagnosis not present

## 2017-12-25 ENCOUNTER — Ambulatory Visit
Admission: RE | Admit: 2017-12-25 | Discharge: 2017-12-25 | Disposition: A | Discharge status: Home / Self Care | Payer: BLUE CROSS/BLUE SHIELD | Source: Ambulatory Visit | Attending: Radiation Oncology | Admitting: Radiation Oncology

## 2017-12-25 DIAGNOSIS — Z510 Encounter for antineoplastic radiation therapy: Secondary | ICD-10-CM | POA: Diagnosis not present

## 2017-12-25 DIAGNOSIS — C61 Malignant neoplasm of prostate: Secondary | ICD-10-CM | POA: Diagnosis not present

## 2017-12-26 ENCOUNTER — Ambulatory Visit
Admission: RE | Admit: 2017-12-26 | Discharge: 2017-12-26 | Disposition: A | Discharge status: Home / Self Care | Payer: BLUE CROSS/BLUE SHIELD | Source: Ambulatory Visit | Attending: Radiation Oncology | Admitting: Radiation Oncology

## 2017-12-26 DIAGNOSIS — C61 Malignant neoplasm of prostate: Secondary | ICD-10-CM | POA: Diagnosis not present

## 2017-12-26 DIAGNOSIS — Z510 Encounter for antineoplastic radiation therapy: Secondary | ICD-10-CM | POA: Diagnosis not present

## 2017-12-29 ENCOUNTER — Ambulatory Visit
Admission: RE | Admit: 2017-12-29 | Discharge: 2017-12-29 | Disposition: A | Discharge status: Home / Self Care | Payer: BLUE CROSS/BLUE SHIELD | Source: Ambulatory Visit | Attending: Radiation Oncology | Admitting: Radiation Oncology

## 2017-12-29 DIAGNOSIS — C61 Malignant neoplasm of prostate: Secondary | ICD-10-CM | POA: Diagnosis not present

## 2017-12-29 DIAGNOSIS — Z510 Encounter for antineoplastic radiation therapy: Secondary | ICD-10-CM | POA: Diagnosis not present

## 2017-12-30 ENCOUNTER — Ambulatory Visit
Admission: RE | Admit: 2017-12-30 | Discharge: 2017-12-30 | Disposition: A | Discharge status: Home / Self Care | Payer: BLUE CROSS/BLUE SHIELD | Source: Ambulatory Visit | Attending: Radiation Oncology | Admitting: Radiation Oncology

## 2017-12-30 DIAGNOSIS — Z510 Encounter for antineoplastic radiation therapy: Secondary | ICD-10-CM | POA: Diagnosis not present

## 2017-12-30 DIAGNOSIS — C61 Malignant neoplasm of prostate: Secondary | ICD-10-CM | POA: Diagnosis not present

## 2017-12-31 ENCOUNTER — Ambulatory Visit
Admission: RE | Admit: 2017-12-31 | Discharge: 2017-12-31 | Disposition: A | Discharge status: Home / Self Care | Payer: BLUE CROSS/BLUE SHIELD | Source: Ambulatory Visit | Attending: Radiation Oncology | Admitting: Radiation Oncology

## 2017-12-31 DIAGNOSIS — Z510 Encounter for antineoplastic radiation therapy: Secondary | ICD-10-CM | POA: Diagnosis not present

## 2017-12-31 DIAGNOSIS — C61 Malignant neoplasm of prostate: Secondary | ICD-10-CM | POA: Diagnosis not present

## 2018-01-01 ENCOUNTER — Ambulatory Visit
Admission: RE | Admit: 2018-01-01 | Discharge: 2018-01-01 | Disposition: A | Discharge status: Home / Self Care | Payer: BLUE CROSS/BLUE SHIELD | Source: Ambulatory Visit | Attending: Radiation Oncology | Admitting: Radiation Oncology

## 2018-01-01 DIAGNOSIS — Z510 Encounter for antineoplastic radiation therapy: Secondary | ICD-10-CM | POA: Diagnosis not present

## 2018-01-01 DIAGNOSIS — C61 Malignant neoplasm of prostate: Secondary | ICD-10-CM | POA: Diagnosis not present

## 2018-01-02 ENCOUNTER — Encounter: Payer: Self-pay | Admitting: Medical Oncology

## 2018-01-02 ENCOUNTER — Ambulatory Visit
Admission: RE | Admit: 2018-01-02 | Discharge: 2018-01-02 | Disposition: A | Discharge status: Home / Self Care | Payer: BLUE CROSS/BLUE SHIELD | Source: Ambulatory Visit | Attending: Radiation Oncology | Admitting: Radiation Oncology

## 2018-01-02 DIAGNOSIS — C61 Malignant neoplasm of prostate: Secondary | ICD-10-CM | POA: Diagnosis not present

## 2018-01-02 DIAGNOSIS — Z510 Encounter for antineoplastic radiation therapy: Secondary | ICD-10-CM | POA: Diagnosis not present

## 2018-01-04 ENCOUNTER — Ambulatory Visit: Admission: RE | Admit: 2018-01-04 | Payer: BLUE CROSS/BLUE SHIELD | Source: Ambulatory Visit

## 2018-01-05 ENCOUNTER — Ambulatory Visit
Admission: RE | Admit: 2018-01-05 | Discharge: 2018-01-05 | Disposition: A | Discharge status: Home / Self Care | Payer: BLUE CROSS/BLUE SHIELD | Source: Ambulatory Visit | Attending: Radiation Oncology | Admitting: Radiation Oncology

## 2018-01-05 DIAGNOSIS — C61 Malignant neoplasm of prostate: Secondary | ICD-10-CM | POA: Diagnosis not present

## 2018-01-05 DIAGNOSIS — Z510 Encounter for antineoplastic radiation therapy: Secondary | ICD-10-CM | POA: Diagnosis not present

## 2018-01-06 ENCOUNTER — Ambulatory Visit
Admission: RE | Admit: 2018-01-06 | Discharge: 2018-01-06 | Disposition: A | Discharge status: Home / Self Care | Payer: BLUE CROSS/BLUE SHIELD | Source: Ambulatory Visit | Attending: Radiation Oncology | Admitting: Radiation Oncology

## 2018-01-06 DIAGNOSIS — C61 Malignant neoplasm of prostate: Secondary | ICD-10-CM | POA: Diagnosis not present

## 2018-01-06 DIAGNOSIS — Z510 Encounter for antineoplastic radiation therapy: Secondary | ICD-10-CM | POA: Diagnosis not present

## 2018-01-07 ENCOUNTER — Ambulatory Visit
Admission: RE | Admit: 2018-01-07 | Discharge: 2018-01-07 | Disposition: A | Discharge status: Home / Self Care | Payer: BLUE CROSS/BLUE SHIELD | Source: Ambulatory Visit | Attending: Radiation Oncology | Admitting: Radiation Oncology

## 2018-01-07 DIAGNOSIS — Z510 Encounter for antineoplastic radiation therapy: Secondary | ICD-10-CM | POA: Diagnosis not present

## 2018-01-07 DIAGNOSIS — C61 Malignant neoplasm of prostate: Secondary | ICD-10-CM | POA: Diagnosis not present

## 2018-01-08 ENCOUNTER — Ambulatory Visit
Admission: RE | Admit: 2018-01-08 | Discharge: 2018-01-08 | Disposition: A | Discharge status: Home / Self Care | Payer: BLUE CROSS/BLUE SHIELD | Source: Ambulatory Visit | Attending: Radiation Oncology | Admitting: Radiation Oncology

## 2018-01-08 DIAGNOSIS — C61 Malignant neoplasm of prostate: Secondary | ICD-10-CM | POA: Diagnosis not present

## 2018-01-08 DIAGNOSIS — Z510 Encounter for antineoplastic radiation therapy: Secondary | ICD-10-CM | POA: Diagnosis not present

## 2018-01-09 ENCOUNTER — Ambulatory Visit
Admission: RE | Admit: 2018-01-09 | Discharge: 2018-01-09 | Disposition: A | Discharge status: Home / Self Care | Payer: BLUE CROSS/BLUE SHIELD | Source: Ambulatory Visit | Attending: Radiation Oncology | Admitting: Radiation Oncology

## 2018-01-09 DIAGNOSIS — C61 Malignant neoplasm of prostate: Secondary | ICD-10-CM | POA: Diagnosis not present

## 2018-01-09 DIAGNOSIS — Z510 Encounter for antineoplastic radiation therapy: Secondary | ICD-10-CM | POA: Diagnosis not present

## 2018-01-12 ENCOUNTER — Ambulatory Visit
Admission: RE | Admit: 2018-01-12 | Discharge: 2018-01-12 | Disposition: A | Discharge status: Home / Self Care | Payer: BLUE CROSS/BLUE SHIELD | Source: Ambulatory Visit | Attending: Radiation Oncology | Admitting: Radiation Oncology

## 2018-01-12 DIAGNOSIS — Z510 Encounter for antineoplastic radiation therapy: Secondary | ICD-10-CM | POA: Diagnosis not present

## 2018-01-12 DIAGNOSIS — C61 Malignant neoplasm of prostate: Secondary | ICD-10-CM | POA: Diagnosis not present

## 2018-01-13 ENCOUNTER — Ambulatory Visit
Admission: RE | Admit: 2018-01-13 | Discharge: 2018-01-13 | Disposition: A | Discharge status: Home / Self Care | Payer: BLUE CROSS/BLUE SHIELD | Source: Ambulatory Visit | Attending: Radiation Oncology | Admitting: Radiation Oncology

## 2018-01-13 DIAGNOSIS — Z510 Encounter for antineoplastic radiation therapy: Secondary | ICD-10-CM | POA: Diagnosis not present

## 2018-01-13 DIAGNOSIS — C61 Malignant neoplasm of prostate: Secondary | ICD-10-CM | POA: Diagnosis not present

## 2018-01-14 ENCOUNTER — Ambulatory Visit
Admission: RE | Admit: 2018-01-14 | Discharge: 2018-01-14 | Disposition: A | Discharge status: Home / Self Care | Payer: BLUE CROSS/BLUE SHIELD | Source: Ambulatory Visit | Attending: Radiation Oncology | Admitting: Radiation Oncology

## 2018-01-14 DIAGNOSIS — Z510 Encounter for antineoplastic radiation therapy: Secondary | ICD-10-CM | POA: Diagnosis not present

## 2018-01-14 DIAGNOSIS — C61 Malignant neoplasm of prostate: Secondary | ICD-10-CM | POA: Diagnosis not present

## 2018-01-15 ENCOUNTER — Ambulatory Visit
Admission: RE | Admit: 2018-01-15 | Discharge: 2018-01-15 | Disposition: A | Discharge status: Home / Self Care | Payer: BLUE CROSS/BLUE SHIELD | Source: Ambulatory Visit | Attending: Radiation Oncology | Admitting: Radiation Oncology

## 2018-01-15 DIAGNOSIS — Z510 Encounter for antineoplastic radiation therapy: Secondary | ICD-10-CM | POA: Diagnosis not present

## 2018-01-15 DIAGNOSIS — C61 Malignant neoplasm of prostate: Secondary | ICD-10-CM | POA: Diagnosis not present

## 2018-01-16 ENCOUNTER — Ambulatory Visit
Admission: RE | Admit: 2018-01-16 | Discharge: 2018-01-16 | Disposition: A | Discharge status: Home / Self Care | Payer: BLUE CROSS/BLUE SHIELD | Source: Ambulatory Visit | Attending: Radiation Oncology | Admitting: Radiation Oncology

## 2018-01-16 DIAGNOSIS — Z510 Encounter for antineoplastic radiation therapy: Secondary | ICD-10-CM | POA: Diagnosis not present

## 2018-01-16 DIAGNOSIS — C61 Malignant neoplasm of prostate: Secondary | ICD-10-CM | POA: Diagnosis not present

## 2018-01-20 ENCOUNTER — Ambulatory Visit
Admission: RE | Admit: 2018-01-20 | Discharge: 2018-01-20 | Disposition: A | Discharge status: Home / Self Care | Payer: BLUE CROSS/BLUE SHIELD | Source: Ambulatory Visit | Attending: Radiation Oncology | Admitting: Radiation Oncology

## 2018-01-20 DIAGNOSIS — C61 Malignant neoplasm of prostate: Secondary | ICD-10-CM | POA: Insufficient documentation

## 2018-01-20 DIAGNOSIS — Z510 Encounter for antineoplastic radiation therapy: Secondary | ICD-10-CM | POA: Diagnosis not present

## 2018-01-21 ENCOUNTER — Ambulatory Visit
Admission: RE | Admit: 2018-01-21 | Discharge: 2018-01-21 | Disposition: A | Discharge status: Home / Self Care | Payer: BLUE CROSS/BLUE SHIELD | Source: Ambulatory Visit | Attending: Radiation Oncology | Admitting: Radiation Oncology

## 2018-01-21 DIAGNOSIS — Z510 Encounter for antineoplastic radiation therapy: Secondary | ICD-10-CM | POA: Diagnosis not present

## 2018-01-21 DIAGNOSIS — C61 Malignant neoplasm of prostate: Secondary | ICD-10-CM | POA: Diagnosis not present

## 2018-01-22 ENCOUNTER — Ambulatory Visit
Admission: RE | Admit: 2018-01-22 | Discharge: 2018-01-22 | Disposition: A | Discharge status: Home / Self Care | Payer: BLUE CROSS/BLUE SHIELD | Source: Ambulatory Visit | Attending: Radiation Oncology | Admitting: Radiation Oncology

## 2018-01-22 DIAGNOSIS — C61 Malignant neoplasm of prostate: Secondary | ICD-10-CM | POA: Diagnosis not present

## 2018-01-22 DIAGNOSIS — Z510 Encounter for antineoplastic radiation therapy: Secondary | ICD-10-CM | POA: Diagnosis not present

## 2018-01-23 ENCOUNTER — Ambulatory Visit
Admission: RE | Admit: 2018-01-23 | Discharge: 2018-01-23 | Disposition: A | Discharge status: Home / Self Care | Payer: BLUE CROSS/BLUE SHIELD | Source: Ambulatory Visit | Attending: Radiation Oncology | Admitting: Radiation Oncology

## 2018-01-23 DIAGNOSIS — Z510 Encounter for antineoplastic radiation therapy: Secondary | ICD-10-CM | POA: Diagnosis not present

## 2018-01-23 DIAGNOSIS — C61 Malignant neoplasm of prostate: Secondary | ICD-10-CM | POA: Diagnosis not present

## 2018-01-26 ENCOUNTER — Ambulatory Visit
Admission: RE | Admit: 2018-01-26 | Discharge: 2018-01-26 | Disposition: A | Discharge status: Home / Self Care | Payer: BLUE CROSS/BLUE SHIELD | Source: Ambulatory Visit | Attending: Radiation Oncology | Admitting: Radiation Oncology

## 2018-01-26 DIAGNOSIS — C61 Malignant neoplasm of prostate: Secondary | ICD-10-CM | POA: Diagnosis not present

## 2018-01-26 DIAGNOSIS — Z510 Encounter for antineoplastic radiation therapy: Secondary | ICD-10-CM | POA: Diagnosis not present

## 2018-01-27 ENCOUNTER — Ambulatory Visit
Admission: RE | Admit: 2018-01-27 | Discharge: 2018-01-27 | Disposition: A | Discharge status: Home / Self Care | Payer: BLUE CROSS/BLUE SHIELD | Source: Ambulatory Visit | Attending: Radiation Oncology | Admitting: Radiation Oncology

## 2018-01-27 DIAGNOSIS — C61 Malignant neoplasm of prostate: Secondary | ICD-10-CM | POA: Diagnosis not present

## 2018-01-27 DIAGNOSIS — Z510 Encounter for antineoplastic radiation therapy: Secondary | ICD-10-CM | POA: Diagnosis not present

## 2018-01-28 ENCOUNTER — Ambulatory Visit
Admission: RE | Admit: 2018-01-28 | Discharge: 2018-01-28 | Disposition: A | Discharge status: Home / Self Care | Payer: BLUE CROSS/BLUE SHIELD | Source: Ambulatory Visit | Attending: Radiation Oncology | Admitting: Radiation Oncology

## 2018-01-28 DIAGNOSIS — Z510 Encounter for antineoplastic radiation therapy: Secondary | ICD-10-CM | POA: Diagnosis not present

## 2018-01-28 DIAGNOSIS — C61 Malignant neoplasm of prostate: Secondary | ICD-10-CM | POA: Diagnosis not present

## 2018-01-29 ENCOUNTER — Ambulatory Visit
Admission: RE | Admit: 2018-01-29 | Discharge: 2018-01-29 | Disposition: A | Discharge status: Home / Self Care | Payer: BLUE CROSS/BLUE SHIELD | Source: Ambulatory Visit | Attending: Radiation Oncology | Admitting: Radiation Oncology

## 2018-01-29 DIAGNOSIS — C61 Malignant neoplasm of prostate: Secondary | ICD-10-CM | POA: Diagnosis not present

## 2018-01-29 DIAGNOSIS — Z510 Encounter for antineoplastic radiation therapy: Secondary | ICD-10-CM | POA: Diagnosis not present

## 2018-01-30 ENCOUNTER — Ambulatory Visit
Admission: RE | Admit: 2018-01-30 | Discharge: 2018-01-30 | Disposition: A | Discharge status: Home / Self Care | Payer: BLUE CROSS/BLUE SHIELD | Source: Ambulatory Visit | Attending: Radiation Oncology | Admitting: Radiation Oncology

## 2018-01-30 DIAGNOSIS — C61 Malignant neoplasm of prostate: Secondary | ICD-10-CM | POA: Diagnosis not present

## 2018-01-30 DIAGNOSIS — Z510 Encounter for antineoplastic radiation therapy: Secondary | ICD-10-CM | POA: Diagnosis not present

## 2018-02-02 ENCOUNTER — Encounter: Payer: Self-pay | Admitting: Radiation Oncology

## 2018-02-02 ENCOUNTER — Encounter: Payer: Self-pay | Admitting: Medical Oncology

## 2018-02-02 ENCOUNTER — Ambulatory Visit
Admission: RE | Admit: 2018-02-02 | Discharge: 2018-02-02 | Disposition: A | Discharge status: Home / Self Care | Payer: BLUE CROSS/BLUE SHIELD | Source: Ambulatory Visit | Attending: Radiation Oncology | Admitting: Radiation Oncology

## 2018-02-02 DIAGNOSIS — C61 Malignant neoplasm of prostate: Secondary | ICD-10-CM | POA: Diagnosis not present

## 2018-02-02 DIAGNOSIS — Z510 Encounter for antineoplastic radiation therapy: Secondary | ICD-10-CM | POA: Diagnosis not present

## 2018-02-05 DIAGNOSIS — D225 Melanocytic nevi of trunk: Secondary | ICD-10-CM | POA: Diagnosis not present

## 2018-02-05 DIAGNOSIS — L814 Other melanin hyperpigmentation: Secondary | ICD-10-CM | POA: Diagnosis not present

## 2018-02-05 NOTE — Progress Notes (Signed)
°  Radiation Oncology         [id number]) [phone] ________________________________  Name: Brian Clayton MRN: [id number]  Date: 02/02/2018  DOB: Nov 19, 1955  End of Treatment Note  Diagnosis:   62 y.o. male with stage T1c N0 N0 adenocarcinoma of the prostate with a Gleason's score of 3+4 and a PSA of 3.77, now with rising PSA (5.98) s/p primary treatment with HiFU in 03/2016     Indication for treatment:  Curative, Definitive Radiotherapy       Radiation treatment dates:   12/24/2017 - 02/02/2018  Site/dose:   The prostate was treated to 70 Gy in 28 fractions of 2.5 Gy  Beams/energy:   The patient was treated with IMRT using volumetric arc therapy delivering 6 MV X-rays to clockwise and counterclockwise circumferential arcs with a 90 degree collimator offset to avoid dose scalloping.  Image guidance was performed with daily cone beam CT prior to each fraction to align to gold markers in the prostate and assure proper bladder and rectal fill volumes.  Immobilization was achieved with BodyFix custom mold.  Narrative: The patient tolerated radiation treatment relatively well.  He experienced modest fatigue and some minor urinary irritation with urgency, weak stream, incomplete emptying, nocturia x1, and slight dysuria. He denied hematuria or bowel changes except for some rectal bleeding following bowel movements.  Plan: The patient has completed radiation treatment. He will return to radiation oncology clinic for routine followup in one month. I advised him to call or return sooner if he has any questions or concerns related to his recovery or treatment. ________________________________  Sheral Apley. Tammi Klippel, M.D.  This document serves as a record of services personally performed by Tyler Pita, MD. It was created on his behalf by Rae Lips, a trained medical scribe. The creation of this record is based on the scribe's personal observations and the provider's statements to them. This document has  been checked and approved by the attending provider.

## 2018-02-10 DIAGNOSIS — H43811 Vitreous degeneration, right eye: Secondary | ICD-10-CM | POA: Diagnosis not present

## 2018-03-03 ENCOUNTER — Other Ambulatory Visit: Payer: Self-pay

## 2018-03-03 ENCOUNTER — Ambulatory Visit
Admission: RE | Admit: 2018-03-03 | Discharge: 2018-03-03 | Disposition: A | Discharge status: Home / Self Care | Payer: BLUE CROSS/BLUE SHIELD | Source: Ambulatory Visit | Attending: Urology | Admitting: Urology

## 2018-03-03 ENCOUNTER — Encounter: Payer: Self-pay | Admitting: Urology

## 2018-03-03 VITALS — BP 121/67 | HR 76 | Temp 97.7°F | Resp 20 | Ht 72.0 in | Wt 180.6 lb

## 2018-03-03 DIAGNOSIS — Z79899 Other long term (current) drug therapy: Secondary | ICD-10-CM | POA: Insufficient documentation

## 2018-03-03 DIAGNOSIS — Z923 Personal history of irradiation: Secondary | ICD-10-CM | POA: Diagnosis not present

## 2018-03-03 DIAGNOSIS — C61 Malignant neoplasm of prostate: Secondary | ICD-10-CM | POA: Insufficient documentation

## 2018-03-03 NOTE — Progress Notes (Signed)
Radiation Oncology         (336) [phone] ________________________________  Name: Brian Clayton MRN: [id number]  Date: 03/03/2018  DOB: 1955-10-11  Post Treatment Note  CC: Brian Cruel, MD  Brian Seal, MD  Diagnosis:   62 y.o. male with stage T1c N0 N0 adenocarcinoma of the prostate with a Gleason's score of 3+4 and a PSA of 3.77, now with rising PSA (5.98) s/p primary treatment with HiFU in 03/2016     Interval Since Last Radiation:  4 weeks  12/24/2017 - 02/02/2018:  The prostate was treated to 70 Gy in 28 fractions of 2.5 Gy  Narrative:  The patient returns today for routine follow-up.  He tolerated radiation treatment relatively well.  He experienced modest fatigue and some minor urinary irritation with urgency, weak stream, incomplete emptying, nocturia x1, and slight dysuria. He denied hematuria or bowel changes except for some rectal bleeding following bowel movements.                             On review of systems, the patient states that he is doing excellent overall.  He continues with mild fatigue which is gradually improving.  His current IPSS score is 9 indicating mild to moderate urinary symptoms with improving force of stream and bladder emptying.  He specifically denies gross hematuria, dysuria, excessive daytime frequency, urgency, straining to void or incontinence.  Nocturia is 1 time per night.  He reports a healthy appetite and is maintaining his weight.  He denies abdominal pain, nausea, vomiting, diarrhea or constipation.  Overall, he is quite pleased with his progress to date.  ALLERGIES:  has No Known Allergies.  Meds: Current Outpatient Medications  Medication Sig Dispense Refill  . propranolol (INDERAL) 10 MG tablet Take 10 mg by mouth 2 (two) times daily as needed (per pt "takes when shooting to steady nerves").     . ALPRAZolam (XANAX) 0.5 MG tablet Take 1 tablet (0.5 mg total) by mouth as needed for up to 2 doses for anxiety (30 minutes prior to MRI, may  repeat once 30 minutes later). (Patient not taking: Reported on 03/03/2018) 2 tablet 0  . sildenafil (VIAGRA) 25 MG tablet Take 25 mg by mouth daily as needed for erectile dysfunction.    . valACYclovir (VALTREX) 1000 MG tablet Take by mouth as needed.      No current facility-administered medications for this encounter.     Physical Findings:  height is 6' (1.829 m) and weight is 180 lb 9.6 oz (81.9 kg). His oral temperature is 97.7 F (36.5 C). His blood pressure is 121/67 and his pulse is 76. His respiration is 20 and oxygen saturation is 97%.  Pain Assessment Pain Score: 0-No pain/10 In general this is a well appearing Caucasian male in no acute distress.  He's alert and oriented x4 and appropriate throughout the examination. Cardiopulmonary assessment is negative for acute distress and he exhibits normal effort.   Lab Findings: No results found for: WBC, HGB, HCT, MCV, PLT   Radiographic Findings: No results found.  Impression/Plan: 1. 62 y.o. male with stage T1c N0 N0 adenocarcinoma of the prostate with a Gleason's score of 3+4 and a PSA of 3.77, now with rising PSA (5.98) s/p primary treatment with HiFU in 03/2016.   He will continue to follow up with urology for ongoing PSA determinations but does not currently have a follow-up appointment scheduled with Dr. Jeffie Pollock.  He anticipates a  follow-up visit with urology for repeat PSA in December 2019.  He understands what to expect with regards to PSA monitoring going forward. I will look forward to following his response to treatment via correspondence with urology, and would be happy to continue to participate in his care if clinically indicated. I talked to the patient about what to expect in the future, including his risk for erectile dysfunction and rectal bleeding. I encouraged him to call or return to the office if he has any questions regarding his previous radiation or possible radiation side effects. He was comfortable with this plan  and will follow up as needed.    Brian Johns, PA-C

## 2018-03-04 DIAGNOSIS — H43811 Vitreous degeneration, right eye: Secondary | ICD-10-CM | POA: Diagnosis not present

## 2018-03-10 DIAGNOSIS — D229 Melanocytic nevi, unspecified: Secondary | ICD-10-CM | POA: Diagnosis not present

## 2018-03-10 DIAGNOSIS — D179 Benign lipomatous neoplasm, unspecified: Secondary | ICD-10-CM | POA: Diagnosis not present

## 2018-03-19 DIAGNOSIS — H5213 Myopia, bilateral: Secondary | ICD-10-CM | POA: Diagnosis not present

## 2018-04-07 DIAGNOSIS — D170 Benign lipomatous neoplasm of skin and subcutaneous tissue of head, face and neck: Secondary | ICD-10-CM | POA: Diagnosis not present

## 2018-06-02 DIAGNOSIS — C61 Malignant neoplasm of prostate: Secondary | ICD-10-CM | POA: Diagnosis not present

## 2018-06-10 DIAGNOSIS — C61 Malignant neoplasm of prostate: Secondary | ICD-10-CM | POA: Diagnosis not present

## 2018-06-10 DIAGNOSIS — R9721 Rising PSA following treatment for malignant neoplasm of prostate: Secondary | ICD-10-CM | POA: Diagnosis not present

## 2018-08-10 DIAGNOSIS — Z8546 Personal history of malignant neoplasm of prostate: Secondary | ICD-10-CM | POA: Diagnosis not present

## 2018-08-10 DIAGNOSIS — R361 Hematospermia: Secondary | ICD-10-CM | POA: Diagnosis not present

## 2018-10-28 DIAGNOSIS — D485 Neoplasm of uncertain behavior of skin: Secondary | ICD-10-CM | POA: Diagnosis not present

## 2018-12-02 DIAGNOSIS — Z8546 Personal history of malignant neoplasm of prostate: Secondary | ICD-10-CM | POA: Diagnosis not present

## 2018-12-09 DIAGNOSIS — R361 Hematospermia: Secondary | ICD-10-CM | POA: Diagnosis not present

## 2018-12-09 DIAGNOSIS — N5201 Erectile dysfunction due to arterial insufficiency: Secondary | ICD-10-CM | POA: Diagnosis not present

## 2018-12-09 DIAGNOSIS — C61 Malignant neoplasm of prostate: Secondary | ICD-10-CM | POA: Diagnosis not present

## 2019-02-26 DIAGNOSIS — Z20828 Contact with and (suspected) exposure to other viral communicable diseases: Secondary | ICD-10-CM | POA: Diagnosis not present

## 2019-06-08 DIAGNOSIS — C61 Malignant neoplasm of prostate: Secondary | ICD-10-CM | POA: Diagnosis not present

## 2019-06-16 DIAGNOSIS — Z8546 Personal history of malignant neoplasm of prostate: Secondary | ICD-10-CM | POA: Diagnosis not present

## 2019-06-16 DIAGNOSIS — N5201 Erectile dysfunction due to arterial insufficiency: Secondary | ICD-10-CM | POA: Diagnosis not present

## 2019-06-30 DIAGNOSIS — H524 Presbyopia: Secondary | ICD-10-CM | POA: Diagnosis not present

## 2019-06-30 DIAGNOSIS — H5213 Myopia, bilateral: Secondary | ICD-10-CM | POA: Diagnosis not present

## 2019-07-24 ENCOUNTER — Ambulatory Visit: Payer: Self-pay | Attending: Internal Medicine

## 2019-07-24 DIAGNOSIS — Z23 Encounter for immunization: Secondary | ICD-10-CM | POA: Insufficient documentation

## 2019-07-24 NOTE — Progress Notes (Signed)
   Covid-19 Vaccination Clinic  Name:  Brian Clayton    MRN: [id number] DOB: [birth year]  07/24/2019  Brian Clayton was observed post Covid-19 immunization for 15 minutes without incident. He was provided with Vaccine Information Sheet and instruction to access the V-Safe system.   Brian Clayton was instructed to call 911 with any severe reactions post vaccine: Marland Kitchen Difficulty breathing  . Swelling of face and throat  . A fast heartbeat  . A bad rash all over body  . Dizziness and weakness   Immunizations Administered    Name Date Dose VIS Date Route   Pfizer COVID-19 Vaccine 07/24/2019 12:37 PM 0.3 mL 04/30/2019 Intramuscular   Manufacturer: [place]   Lot: [id number]   [place]: [id number]

## 2019-08-14 ENCOUNTER — Ambulatory Visit: Payer: Self-pay | Attending: Internal Medicine

## 2019-08-14 DIAGNOSIS — Z23 Encounter for immunization: Secondary | ICD-10-CM

## 2019-08-14 NOTE — Progress Notes (Signed)
   Covid-19 Vaccination Clinic  Name:  Otie Kallenbach    MRN: [id number] DOB: [birth year]  08/14/2019  Mr. Procter was observed post Covid-19 immunization for 15 minutes without incident. He was provided with Vaccine Information Sheet and instruction to access the V-Safe system.   Mr. Kochanski was instructed to call 911 with any severe reactions post vaccine: Marland Kitchen Difficulty breathing  . Swelling of face and throat  . A fast heartbeat  . A bad rash all over body  . Dizziness and weakness   Immunizations Administered    Name Date Dose VIS Date Route   Pfizer COVID-19 Vaccine 08/14/2019 12:09 PM 0.3 mL 04/30/2019 Intramuscular   Manufacturer: [place]   Lot: [id number]   [place]: [id number]

## 2019-09-28 ENCOUNTER — Other Ambulatory Visit (HOSPITAL_COMMUNITY): Payer: Self-pay | Admitting: Family Medicine

## 2019-09-28 DIAGNOSIS — R0989 Other specified symptoms and signs involving the circulatory and respiratory systems: Secondary | ICD-10-CM

## 2019-09-28 DIAGNOSIS — R7309 Other abnormal glucose: Secondary | ICD-10-CM | POA: Diagnosis not present

## 2019-09-28 DIAGNOSIS — Z13220 Encounter for screening for lipoid disorders: Secondary | ICD-10-CM | POA: Diagnosis not present

## 2019-09-28 DIAGNOSIS — Z0000 Encounter for general adult medical examination without abnormal findings: Secondary | ICD-10-CM | POA: Diagnosis not present

## 2019-10-04 ENCOUNTER — Ambulatory Visit (HOSPITAL_COMMUNITY)
Admission: RE | Admit: 2019-10-04 | Discharge: 2019-10-04 | Disposition: A | Discharge status: Home / Self Care | Payer: BC Managed Care – PPO | Source: Ambulatory Visit | Attending: Family Medicine | Admitting: Family Medicine

## 2019-10-04 ENCOUNTER — Other Ambulatory Visit: Payer: Self-pay

## 2019-10-04 DIAGNOSIS — Z87891 Personal history of nicotine dependence: Secondary | ICD-10-CM | POA: Diagnosis not present

## 2019-10-04 DIAGNOSIS — R0989 Other specified symptoms and signs involving the circulatory and respiratory systems: Secondary | ICD-10-CM | POA: Diagnosis not present

## 2019-10-04 DIAGNOSIS — C61 Malignant neoplasm of prostate: Secondary | ICD-10-CM | POA: Diagnosis not present

## 2019-10-04 NOTE — Progress Notes (Signed)
VASCULAR LAB PRELIMINARY  PRELIMINARY  PRELIMINARY  PRELIMINARY  Carotid duplex completed.    Preliminary report:  See CV proc for preliminary results.   Camila Norville, RVT 10/04/2019, 10:21 AM

## 2019-10-04 NOTE — Progress Notes (Signed)
VASCULAR LAB PRELIMINARY  PRELIMINARY  PRELIMINARY  PRELIMINARY  Carotid duplex completed.    Preliminary report:  See CV proc for preliminary results.   Baldwin Racicot, RVT 10/04/2019, 10:59 AM

## 2019-12-07 DIAGNOSIS — C61 Malignant neoplasm of prostate: Secondary | ICD-10-CM | POA: Diagnosis not present

## 2019-12-15 DIAGNOSIS — Z8546 Personal history of malignant neoplasm of prostate: Secondary | ICD-10-CM | POA: Diagnosis not present

## 2019-12-15 DIAGNOSIS — N5201 Erectile dysfunction due to arterial insufficiency: Secondary | ICD-10-CM | POA: Diagnosis not present

## 2020-05-09 DIAGNOSIS — Z20822 Contact with and (suspected) exposure to covid-19: Secondary | ICD-10-CM | POA: Diagnosis not present

## 2020-06-07 DIAGNOSIS — Z8546 Personal history of malignant neoplasm of prostate: Secondary | ICD-10-CM | POA: Diagnosis not present

## 2020-06-14 DIAGNOSIS — Z8546 Personal history of malignant neoplasm of prostate: Secondary | ICD-10-CM | POA: Diagnosis not present

## 2020-06-14 DIAGNOSIS — N5201 Erectile dysfunction due to arterial insufficiency: Secondary | ICD-10-CM | POA: Diagnosis not present

## 2020-06-26 DIAGNOSIS — Z20822 Contact with and (suspected) exposure to covid-19: Secondary | ICD-10-CM | POA: Diagnosis not present

## 2020-09-27 DIAGNOSIS — Z0000 Encounter for general adult medical examination without abnormal findings: Secondary | ICD-10-CM | POA: Diagnosis not present

## 2020-09-27 DIAGNOSIS — Z13220 Encounter for screening for lipoid disorders: Secondary | ICD-10-CM | POA: Diagnosis not present

## 2020-10-10 DIAGNOSIS — Z23 Encounter for immunization: Secondary | ICD-10-CM | POA: Diagnosis not present

## 2020-10-10 DIAGNOSIS — Z0000 Encounter for general adult medical examination without abnormal findings: Secondary | ICD-10-CM | POA: Diagnosis not present

## 2020-10-12 ENCOUNTER — Other Ambulatory Visit: Payer: Self-pay | Admitting: Family Medicine

## 2020-10-12 DIAGNOSIS — E7800 Pure hypercholesterolemia, unspecified: Secondary | ICD-10-CM

## 2020-10-18 ENCOUNTER — Telehealth: Payer: Self-pay | Admitting: Dermatology

## 2020-10-18 NOTE — Telephone Encounter (Signed)
Patient is calling for a referral appointment from Lona Kettle, MD.  Patient is scheduled for 12/20/2020 at 2:45 with [name], MD.

## 2020-10-23 NOTE — Telephone Encounter (Signed)
Referral attached to appointment

## 2020-11-10 ENCOUNTER — Ambulatory Visit
Admission: RE | Admit: 2020-11-10 | Discharge: 2020-11-10 | Disposition: A | Discharge status: Home / Self Care | Payer: No Typology Code available for payment source | Source: Ambulatory Visit | Attending: Family Medicine | Admitting: Family Medicine

## 2020-11-10 DIAGNOSIS — E7800 Pure hypercholesterolemia, unspecified: Secondary | ICD-10-CM

## 2020-11-10 DIAGNOSIS — E785 Hyperlipidemia, unspecified: Secondary | ICD-10-CM | POA: Diagnosis not present

## 2020-12-06 DIAGNOSIS — Z8546 Personal history of malignant neoplasm of prostate: Secondary | ICD-10-CM | POA: Diagnosis not present

## 2020-12-13 DIAGNOSIS — N5201 Erectile dysfunction due to arterial insufficiency: Secondary | ICD-10-CM | POA: Diagnosis not present

## 2020-12-13 DIAGNOSIS — Z8546 Personal history of malignant neoplasm of prostate: Secondary | ICD-10-CM | POA: Diagnosis not present

## 2020-12-20 ENCOUNTER — Encounter: Payer: Self-pay | Admitting: Dermatology

## 2020-12-20 ENCOUNTER — Ambulatory Visit (INDEPENDENT_AMBULATORY_CARE_PROVIDER_SITE_OTHER): Payer: BC Managed Care – PPO | Admitting: Dermatology

## 2020-12-20 ENCOUNTER — Other Ambulatory Visit: Payer: Self-pay

## 2020-12-20 DIAGNOSIS — L815 Leukoderma, not elsewhere classified: Secondary | ICD-10-CM

## 2020-12-20 DIAGNOSIS — L821 Other seborrheic keratosis: Secondary | ICD-10-CM

## 2020-12-20 DIAGNOSIS — L300 Nummular dermatitis: Secondary | ICD-10-CM

## 2020-12-20 DIAGNOSIS — Z1283 Encounter for screening for malignant neoplasm of skin: Secondary | ICD-10-CM

## 2020-12-20 DIAGNOSIS — D1721 Benign lipomatous neoplasm of skin and subcutaneous tissue of right arm: Secondary | ICD-10-CM

## 2020-12-20 DIAGNOSIS — D1720 Benign lipomatous neoplasm of skin and subcutaneous tissue of unspecified limb: Secondary | ICD-10-CM

## 2020-12-20 MED ORDER — CLOBETASOL PROP EMOLLIENT BASE 0.05 % EX CREA
TOPICAL_CREAM | CUTANEOUS | 0 refills | Status: DC
Start: 2020-12-20 — End: 2020-12-21

## 2020-12-21 MED ORDER — CLOBETASOL PROP EMOLLIENT BASE 0.05 % EX CREA: 30.0000 g | TOPICAL_CREAM | Freq: Every day | CUTANEOUS | 0 refills | Status: AC

## 2021-01-07 ENCOUNTER — Encounter: Payer: Self-pay | Admitting: Dermatology

## 2021-01-07 NOTE — Progress Notes (Signed)
   Follow-Up Visit   Subjective  Brian Clayton is a 65 y.o. male who presents for the following: Annual Exam (Patient here today for yearly skin check, per patient he has a mole on his chest that he doesn't remember having per patient no bleeding, no pain. Per patient no personal history or family history of atypical moles, melanoma or non mole skin cancer. ).  General skin examination, several places to check  Location:  Duration:  Quality:  Associated Signs/Symptoms: Modifying Factors:  Severity:  Timing: Context:   Objective  Well appearing patient in no apparent distress; mood and affect are within normal limits. Torso - Posterior (Back) General skin examination: No atypical pigmented lesions or normal skin cancer.  Right Hand - Anterior Nummular patches of chronic dermatitis, not marginated.  No known contactants.  Right Upper Arm - Anterior 2 doughy soft subcutaneous 2+ centimeter nodules  Right Lower Leg - Anterior Both legs have hypopigmented 5 mm in macules compatible with guttate hypomelanosis  Neck - Anterior Four millimeter light brown in flattopped textured papule    A full examination was performed including scalp, head, eyes, ears, nose, lips, neck, chest, axillae, abdomen, back, buttocks, bilateral upper extremities, bilateral lower extremities, hands, feet, fingers, toes, fingernails, and toenails. All findings within normal limits unless otherwise noted below.   Assessment & Plan    Encounter for screening for malignant neoplasm of skin Torso - Posterior (Back)  Annual skin examination, encouraged to self examine twice annually.  Continued ultraviolet protection.  Nummular eczema Right Hand - Anterior  Apply clobetasol and daily after bathing for 1 to 2 weeks; follow-up by phone at that time.   Related Medications Clobetasol Prop Emollient Base (CLOBETASOL PROPIONATE E) 0.05 % emollient cream Apply 60 application topically daily. Apply to  affected area qd  Lipoma of upper extremity, unspecified laterality Right Upper Arm - Anterior  May leave if stable.  Should patient prefer surgery, schedule I hour.  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  The documentation on 01/07/21 for the exam, diagnosis, procedures, and orders are all accurate and complete.

## 2021-01-25 ENCOUNTER — Ambulatory Visit (INDEPENDENT_AMBULATORY_CARE_PROVIDER_SITE_OTHER): Payer: BC Managed Care – PPO | Admitting: Internal Medicine

## 2021-01-25 ENCOUNTER — Other Ambulatory Visit: Payer: Self-pay

## 2021-01-25 ENCOUNTER — Encounter: Payer: Self-pay | Admitting: Internal Medicine

## 2021-01-25 VITALS — BP 140/74 | HR 60 | Resp 20 | Ht 72.0 in | Wt 184.0 lb

## 2021-01-25 DIAGNOSIS — R011 Cardiac murmur, unspecified: Secondary | ICD-10-CM

## 2021-01-25 DIAGNOSIS — R931 Abnormal findings on diagnostic imaging of heart and coronary circulation: Secondary | ICD-10-CM | POA: Diagnosis not present

## 2021-01-25 DIAGNOSIS — Z8249 Family history of ischemic heart disease and other diseases of the circulatory system: Secondary | ICD-10-CM

## 2021-01-25 DIAGNOSIS — E785 Hyperlipidemia, unspecified: Secondary | ICD-10-CM | POA: Diagnosis not present

## 2021-01-25 DIAGNOSIS — I359 Nonrheumatic aortic valve disorder, unspecified: Secondary | ICD-10-CM

## 2021-01-25 MED ORDER — ATORVASTATIN CALCIUM 40 MG PO TABS: 40.0000 mg | ORAL_TABLET | Freq: Every day | ORAL | 3 refills | Status: DC

## 2021-01-25 NOTE — Progress Notes (Signed)
OFFICE CONSULT NOTE  Chief Complaint:  Abnormal calcium score  Primary Care Physician: [name], MD  HPI:  Brian Clayton is a 65 y.o. male who is being seen today for the evaluation of calcium score at the request of [name], MD. 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  This was performed for cardiovascular risk assessment.  He has a family history of a brother who died of sudden death related to pulmonary embolus.  His father had a coronary stent.  Besides this he is otherwise pretty healthy and asymptomatic.  He does not take regular medicine.  He exercises daily.  He had a coronary calcium score in May which showed abnormal calcification the left main, LAD and left circumflex arteries with a calcium score 51, 50th percentile for age and sex matched controls.  Additionally it was noted that he had heavy calcification of the aortic valve and there was a recommendation for evaluation of possible aortic stenosis.  His primary care provider heard a murmur and possible carotid bruit.  He underwent carotid Dopplers which showed mild carotid artery disease.  He had lipid testing in May showing total cholesterol 213, HDL 61, triglycerides 71 and LDL 140.  A1c 5.4, creatinine 0.95.  PMHx:  Past Medical History:  Diagnosis Date   ED (erectile dysfunction)    Fever blister    occasionally   Prostate cancer [facility]) urologist-  dr wrenn/  oncolgist-  dr manning   dx 12-11-2015 Stage T1c, Gleason 3+4,  PSA 3.77  s/p  HiFU procedure by dr Lewie Loron [facility] [place]);  residual/recurrent prostate cancer by MRI fusion bx 09-29-2017 , Stage T1c, Gleason 3+4, PSA 5.98-  treatment salvage radiotherapy   Wears glasses     Past Surgical History:  Procedure Laterality Date   COLONOSCOPY  12/2013   HIGH INTENSITY FOCUSED ULTRASOUND (HIFU) OF THE PROSTATE  11/ 2017    dr Dorrene German [place]    removal prostate tissue , prostate cancer   IMAGE GUIDED FUSION PROSTATE BIOPSY  09/29/2017   [facility] [place]   transrectal ultrasound/ MRI fusion bx   PROSTATE BIOPSY  12/11/2015   TONSILLECTOMY  age 26    FAMHx:  Family History  Problem Relation Age of Onset   Cancer Father        leukemia   CAD Father    Cancer Sister        breast   Pulmonary embolism Brother    Cancer Maternal Uncle        prostate    SOCHx:   reports that he has been smoking cigars. He has never used smokeless tobacco. He reports current alcohol use of about 4.0 standard drinks per week. He reports that he does not use drugs.  ALLERGIES:  No Known Allergies  ROS: Pertinent items noted in HPI and remainder of comprehensive ROS otherwise negative.  HOME MEDS: Current Outpatient Medications on File Prior to Visit  Medication Sig Dispense Refill   aspirin EC 81 MG tablet Take 81 mg by mouth daily. Swallow whole.     propranolol (INDERAL) 10 MG tablet Take 10 mg by mouth 2 (two) times daily as needed (per pt "takes when shooting to steady nerves").      sildenafil (VIAGRA) 25 MG tablet Take 100 mg by mouth daily as needed for erectile dysfunction.     triamcinolone  cream (KENALOG) 0.1 % SMARTSIG:2 Topical Twice Daily     valACYclovir (VALTREX) 1000 MG tablet Take by mouth as needed.      No current facility-administered medications on file prior to visit.    LABS/IMAGING: No results found for this or any previous visit (from the past 48 hour(s)). No results found.  LIPID PANEL: No results found for: CHOL, TRIG, HDL, CHOLHDL, VLDL, LDLCALC, LDLDIRECT  WEIGHTS: Wt Readings from Last 3 Encounters:  01/25/21 184 lb (83.5 kg)  03/03/18 180 lb 9.6 oz (81.9 kg)  10/09/17 187 lb 12.8 oz (85.2 kg)    VITALS: BP 140/74 (BP Location: Left Arm, Patient Position: Sitting)   Pulse 60   Resp 20   Ht 6' (1.829 m)   Wt 184 lb (83.5 kg)   SpO2 98%   BMI 24.95 kg/m   EXAM: General appearance:  alert and no distress Neck: no carotid bruit, no JVD, and thyroid not enlarged, symmetric, no tenderness/mass/nodules Lungs: clear to auscultation bilaterally Heart: regular rate and rhythm, S1, S2 normal, systolic murmur: early systolic 2/6, crescendo at 2nd right intercostal space, and 2nd systolic murmur: systolic ejection 3/6, blowing at apex Abdomen: soft, non-tender; bowel sounds normal; no masses,  no organomegaly Extremities: extremities normal, atraumatic, no cyanosis or edema Pulses: 2+ and symmetric Skin: Skin color, texture, turgor normal. No rashes or lesions Neurologic: Grossly normal Psych: Pleasant  EKG: Normal sinus rhythm at 60- personally reviewed  ASSESSMENT: Abnormal CAC score of 90, 50th percentile for age and sex matched controls (09/2020) Aortic valve calcification-murmur Dyslipidemia Family history of coronary disease in his father  PLAN: 1.   Brian Clayton has an abnormal calcium score 51 with calcium in the left main, LAD and left circumflex arteries.  This was middle-of-the-road compared to age and sex matched controls however more importantly there was aortic valve calcification.  He is noted to have murmur in the aortic area as well as a louder murmur in the apex suggestive of possible aortic stenosis in part perhaps mitral regurgitation or AI.  Would recommend an echo to evaluate this further.  With regards to dyslipidemia, given abnormal coronary calcification would recommend a lower target LDL at less than 70.  Based on shared decision making with his background and pharmacy, he would like to try atorvastatin 40 mg daily.  I agree with this and would also recommend starting aspirin 81 mg daily.  Blood pressure is slightly higher than target however he says he has been at this number for years.  Ultimately we may wish to consider low-dose ARB for cardiovascular benefit and to try to target blood pressure to around 120/80 if possible.  We will contact him with results  of his echo and plan a repeat lipid NMR and LP(a) (given family history of PE and his brother) and follow-up with me in 3 months.  Thanks again for the kind referral.  Pixie Casino, MD, FACC, [place] Director of the [facility] Lipid [facility]  Cardiovascular Risk Reduction Clinic Diplomate of the American Board of Clinical Lipidology Attending Cardiologist  Direct Dial: [phone]  Fax: [phone]  Website:  [email or web address] Nashawn Hillock 01/25/2021, 10:34 AM

## 2021-01-25 NOTE — Patient Instructions (Signed)
Medication Instructions:  START aspirin '81mg'$  daily  START atorvastatin '40mg'$  daily  *If you need a refill on your cardiac medications before your next appointment, please call your pharmacy*   Lab Work: FASTING lab work in about 3 months  - NMR lipoprofile, LP(a)  If you have labs (blood work) drawn today and your tests are completely normal, you will receive your results only by: [place] (if you have MyChart) OR A paper copy in the mail If you have any lab test that is abnormal or we need to change your treatment, we will call you to review the results.   Testing/Procedures: Your physician has requested that you have an echocardiogram. Echocardiography is a painless test that uses sound waves to create images of your heart. It provides your doctor with information about the size and shape of your heart and how well your heart's chambers and valves are working. This procedure takes approximately one hour. There are no restrictions for this procedure. -- [address]   Follow-Up: At [facility], you and your health needs are our priority.  As part of our continuing mission to provide you with exceptional heart care, we have created designated Provider Care Teams.  These Care Teams include your primary Cardiologist (physician) and Advanced Practice Providers (APPs -  Physician Assistants and Nurse Practitioners) who all work together to provide you with the care you need, when you need it.  We recommend signing up for the patient portal called "MyChart".  Sign up information is provided on this After Visit Summary.  MyChart is used to connect with patients for Virtual Visits (Telemedicine).  Patients are able to view lab/test results, encounter notes, upcoming appointments, etc.  Non-urgent messages can be sent to your provider as well.   To learn more about what you can do with MyChart, go to [email or web address].    Your next appointment:   3-4  month(s)  The format for your next appointment:   In Person  Provider:   Raliegh Ip Mali Hilty, MD  OK to add to DOD day   Other Instructions

## 2021-02-16 ENCOUNTER — Ambulatory Visit (HOSPITAL_COMMUNITY): Payer: BC Managed Care – PPO | Attending: Cardiology

## 2021-02-16 ENCOUNTER — Other Ambulatory Visit: Payer: Self-pay

## 2021-02-16 DIAGNOSIS — R011 Cardiac murmur, unspecified: Secondary | ICD-10-CM | POA: Insufficient documentation

## 2021-02-18 LAB — ECHOCARDIOGRAM COMPLETE
AR max vel: 2.13 cm2
AV Area VTI: 2.03 cm2
AV Area mean vel: 2.01 cm2
AV Mean grad: 13.3 mmHg
AV Peak grad: 25.8 mmHg
Ao pk vel: 2.54 m/s
Area-P 1/2: 2.01 cm2
P 1/2 time: 711 msec
S' Lateral: 3.2 cm

## 2021-02-20 ENCOUNTER — Other Ambulatory Visit: Payer: Self-pay | Admitting: *Deleted

## 2021-02-20 DIAGNOSIS — I351 Nonrheumatic aortic (valve) insufficiency: Secondary | ICD-10-CM

## 2021-02-20 DIAGNOSIS — Q231 Congenital insufficiency of aortic valve: Secondary | ICD-10-CM

## 2021-02-20 DIAGNOSIS — I77810 Thoracic aortic ectasia: Secondary | ICD-10-CM

## 2021-06-07 ENCOUNTER — Ambulatory Visit: Payer: BC Managed Care – PPO | Admitting: Internal Medicine

## 2021-06-13 DIAGNOSIS — Z8546 Personal history of malignant neoplasm of prostate: Secondary | ICD-10-CM | POA: Diagnosis not present

## 2021-06-20 DIAGNOSIS — Z8546 Personal history of malignant neoplasm of prostate: Secondary | ICD-10-CM | POA: Diagnosis not present

## 2021-06-20 DIAGNOSIS — R9721 Rising PSA following treatment for malignant neoplasm of prostate: Secondary | ICD-10-CM | POA: Diagnosis not present

## 2021-06-20 DIAGNOSIS — N5201 Erectile dysfunction due to arterial insufficiency: Secondary | ICD-10-CM | POA: Diagnosis not present

## 2021-07-26 ENCOUNTER — Encounter: Payer: Self-pay | Admitting: Dermatology

## 2021-07-26 ENCOUNTER — Other Ambulatory Visit: Payer: Self-pay

## 2021-07-26 ENCOUNTER — Ambulatory Visit (INDEPENDENT_AMBULATORY_CARE_PROVIDER_SITE_OTHER): Payer: BC Managed Care – PPO | Admitting: Dermatology

## 2021-07-26 DIAGNOSIS — L820 Inflamed seborrheic keratosis: Secondary | ICD-10-CM

## 2021-07-26 DIAGNOSIS — L570 Actinic keratosis: Secondary | ICD-10-CM

## 2021-07-26 DIAGNOSIS — L309 Dermatitis, unspecified: Secondary | ICD-10-CM

## 2021-07-26 DIAGNOSIS — D485 Neoplasm of uncertain behavior of skin: Secondary | ICD-10-CM

## 2021-07-26 MED ORDER — CLOBETASOL PROP EMOLLIENT BASE 0.05 % EX CREA
1.0000 | TOPICAL_CREAM | Freq: Two times a day (BID) | CUTANEOUS | 2 refills | Status: AC
Start: 2021-07-26 — End: ?

## 2021-07-26 NOTE — Patient Instructions (Signed)

## 2021-08-06 ENCOUNTER — Encounter: Payer: Self-pay | Admitting: Dermatology

## 2021-08-06 NOTE — Progress Notes (Signed)
? ?  Follow-Up Visit ?  ?Subjective  ?Brian Clayton is a 66 y.o. male who presents for the following: Skin Problem (Patient here today to have lesion on his chest x 6 months no bleeding, no pain. No personal history or family history of atypical moles, melanoma or non mole skin cancer. ). ? ?Growth on chest, spot on cheek, rash on back ?Location:  ?Duration:  ?Quality:  ?Associated Signs/Symptoms: ?Modifying Factors:  ?Severity:  ?Timing: ?Context:  ? ?Objective  ?Well appearing patient in no apparent distress; mood and affect are within normal limits. ?Chest - Medial [facility]) ?Focally eroded waxy pink 6 mm papule ? ? ? ? ? ? ?Left Buccal Cheek ?Gritty 5 mm pink crust ? ?Lower back ?Patchy eczematous dermatitis of uncertain origin. ? ? ? ?All skin waist up examined. ? ? ?Assessment & Plan  ? ? ?Neoplasm of uncertain behavior of skin ?Chest - Medial [facility]) ? ?Skin / nail biopsy ?Type of biopsy: tangential   ?Informed consent: discussed and consent obtained   ?Timeout: patient name, date of birth, surgical site, and procedure verified   ?Anesthesia: the lesion was anesthetized in a standard fashion   ?Anesthetic:  1% lidocaine w/ epinephrine 1-100,000 local infiltration ?Instrument used: flexible razor blade   ?Hemostasis achieved with: ferric subsulfate and electrodesiccation   ?Outcome: patient tolerated procedure well   ?Post-procedure details: wound care instructions given   ? ?Specimen 1 - Surgical pathology ?Differential Diagnosis: R/O BCC VS SCC ? ?Check Margins: No ? ?Actinic keratosis ?Left Buccal Cheek ? ?Destruction of lesion - Left Buccal Cheek ?Complexity: simple   ?Destruction method: cryotherapy   ?Informed consent: discussed and consent obtained   ?Lesion destroyed using liquid nitrogen: Yes   ?Cryotherapy cycles:  3 ?Outcome: patient tolerated procedure well with no complications   ? ?Dermatitis ?Lower back ? ?We will try daily application of clobetasol after bathing for up to 4 weeks.  Avoid use on  face and body folds.  Should this fail he will return for reevaluation. ? ?Related Medications ?Clobetasol Prop Emollient Base (CLOBETASOL PROPIONATE E) 0.05 % emollient cream ?Apply 1 application. topically 2 (two) times daily. ? ? ? ? ? ?I, [name], MD, have reviewed all documentation for this visit.  The documentation on 08/06/21 for the exam, diagnosis, procedures, and orders are all accurate and complete. ?

## 2021-12-26 ENCOUNTER — Ambulatory Visit: Payer: BC Managed Care – PPO | Admitting: Dermatology

## 2022-01-25 DIAGNOSIS — Z8546 Personal history of malignant neoplasm of prostate: Secondary | ICD-10-CM | POA: Diagnosis not present

## 2022-01-29 DIAGNOSIS — Z131 Encounter for screening for diabetes mellitus: Secondary | ICD-10-CM | POA: Diagnosis not present

## 2022-01-29 DIAGNOSIS — E7800 Pure hypercholesterolemia, unspecified: Secondary | ICD-10-CM | POA: Diagnosis not present

## 2022-02-05 DIAGNOSIS — H524 Presbyopia: Secondary | ICD-10-CM | POA: Diagnosis not present

## 2022-02-06 DIAGNOSIS — R002 Palpitations: Secondary | ICD-10-CM | POA: Diagnosis not present

## 2022-02-06 DIAGNOSIS — E7800 Pure hypercholesterolemia, unspecified: Secondary | ICD-10-CM | POA: Diagnosis not present

## 2022-02-06 DIAGNOSIS — Z0000 Encounter for general adult medical examination without abnormal findings: Secondary | ICD-10-CM | POA: Diagnosis not present

## 2022-02-06 DIAGNOSIS — N529 Male erectile dysfunction, unspecified: Secondary | ICD-10-CM | POA: Diagnosis not present

## 2022-02-07 ENCOUNTER — Ambulatory Visit (HOSPITAL_COMMUNITY): Payer: Medicare Other | Attending: Cardiology

## 2022-02-07 DIAGNOSIS — I77810 Thoracic aortic ectasia: Secondary | ICD-10-CM | POA: Insufficient documentation

## 2022-02-07 DIAGNOSIS — Q231 Congenital insufficiency of aortic valve: Secondary | ICD-10-CM | POA: Diagnosis not present

## 2022-02-07 DIAGNOSIS — I351 Nonrheumatic aortic (valve) insufficiency: Secondary | ICD-10-CM | POA: Diagnosis not present

## 2022-02-07 LAB — ECHOCARDIOGRAM COMPLETE
AR max vel: 2.45 cm2
AV Area VTI: 2.42 cm2
AV Area mean vel: 2.31 cm2
AV Mean grad: 12 mmHg
AV Peak grad: 21.5 mmHg
Ao pk vel: 2.32 m/s
Area-P 1/2: 4.49 cm2
P 1/2 time: 504 msec
S' Lateral: 3.4 cm

## 2022-02-10 IMAGING — CT CT CARDIAC CORONARY ARTERY CALCIUM SCORE
3 series · 13 of 20 positions shown, 15 images · non-contrast
Comparison: None.

CLINICAL DATA: 64-year-old Caucasian male with history of
hyperlipidemia.

EXAM:
CT CARDIAC CORONARY ARTERY CALCIUM SCORE
TECHNIQUE: Non-contrast imaging through the heart was performed using
prospective ECG gating. Image post processing was performed on an
independent workstation, allowing for quantitative analysis of the
heart and coronary arteries. Note that this exam targets the heart
and the chest was not imaged in its entirety.

[Series 2: calcium scoring 2.00 qr36 bestdiast 69% hrt calciu · axial · 0.36mm/px · z∈[+1635,+1693]mm · 3 of 73 slices shown]
[im 15/73  vessel]
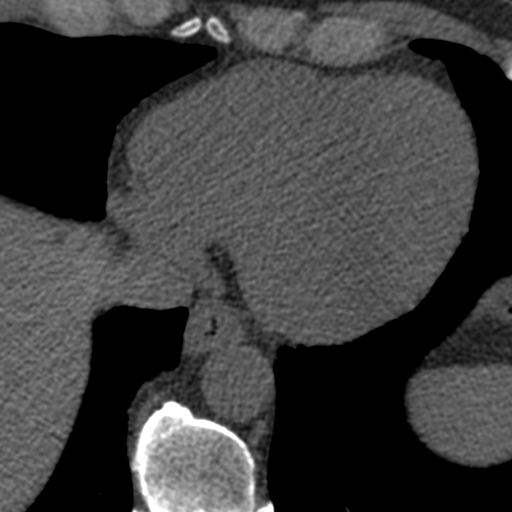
[im 29/73  vessel]
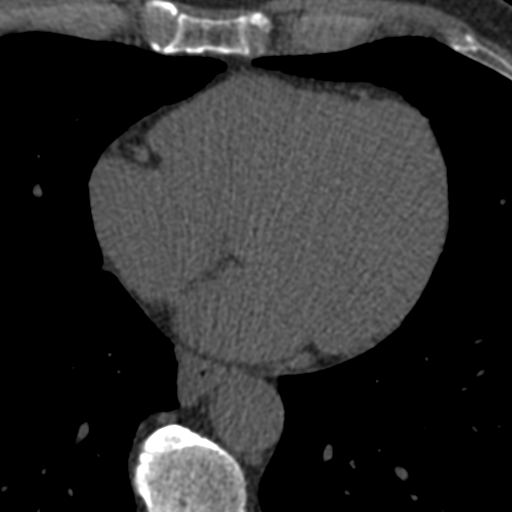
[im 44/73  vessel]
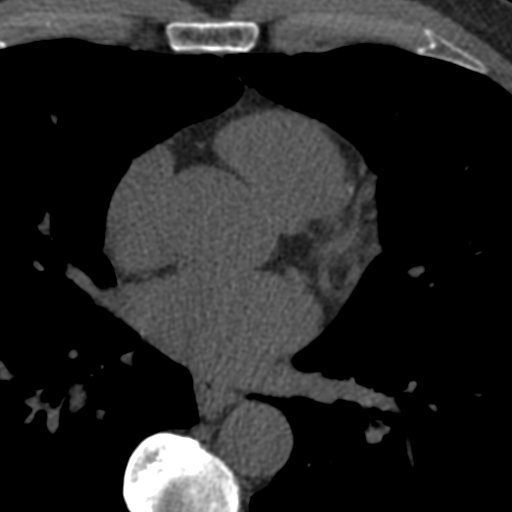

[Series 3: calcium scoring 2.00 br40 bestdiast 69% axial · axial · 0.57mm/px · z∈[+1631,+1727]mm · 5 of 73 slices shown, 7 images]
[im 13/73  vessel]
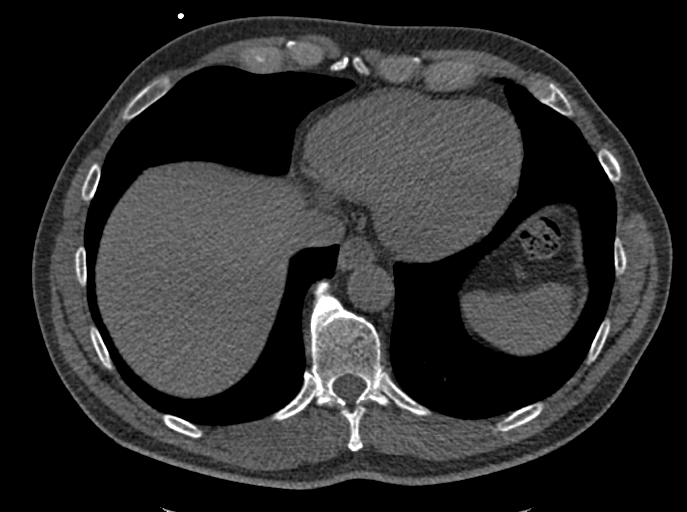
[im 13/73  lung]
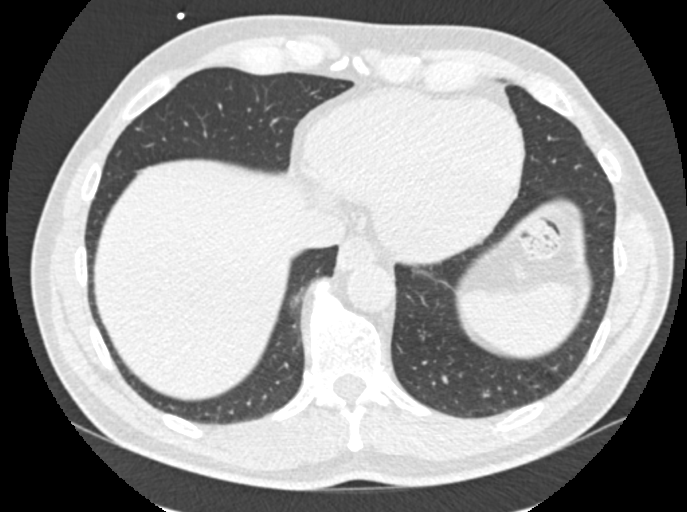
[im 25/73  vessel]
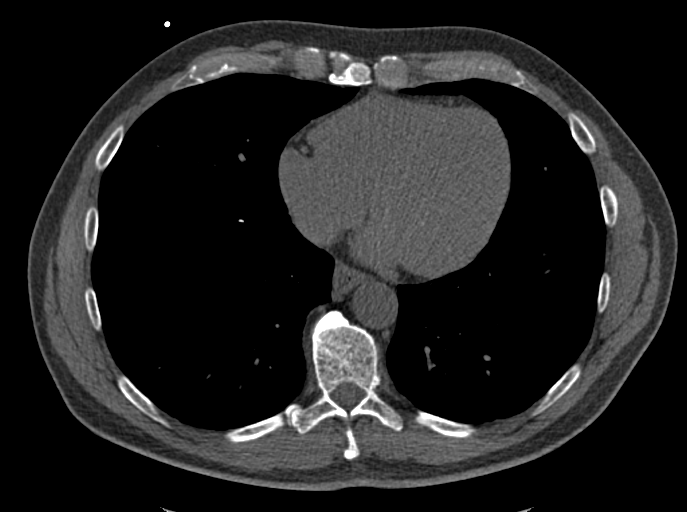
[im 37/73  vessel]
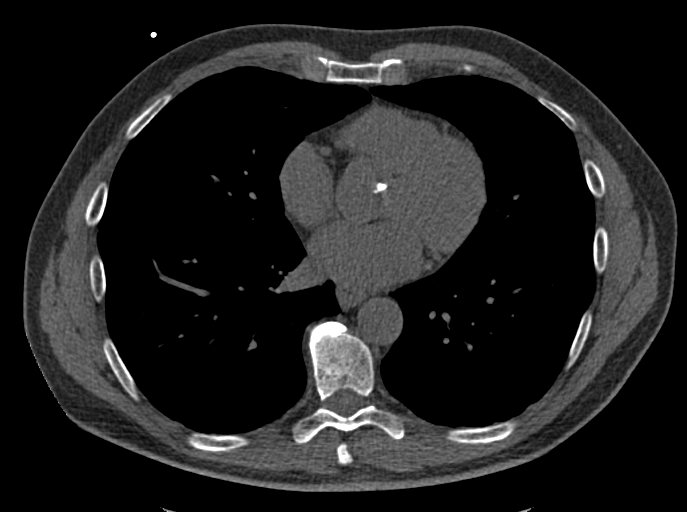
[im 49/73  vessel]
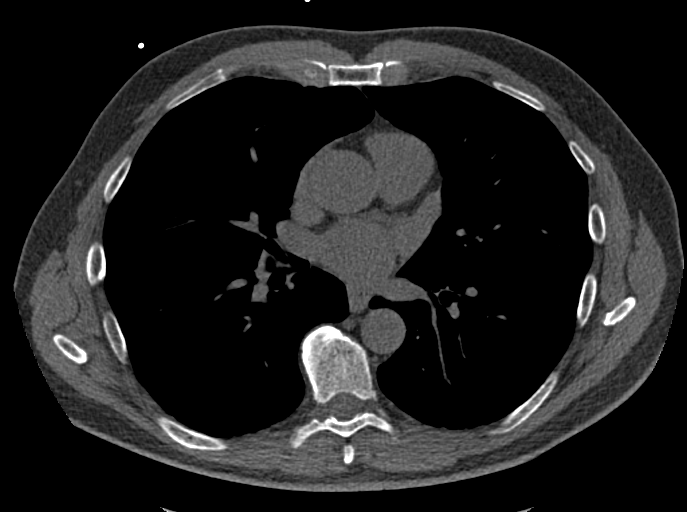
[im 61/73  vessel]
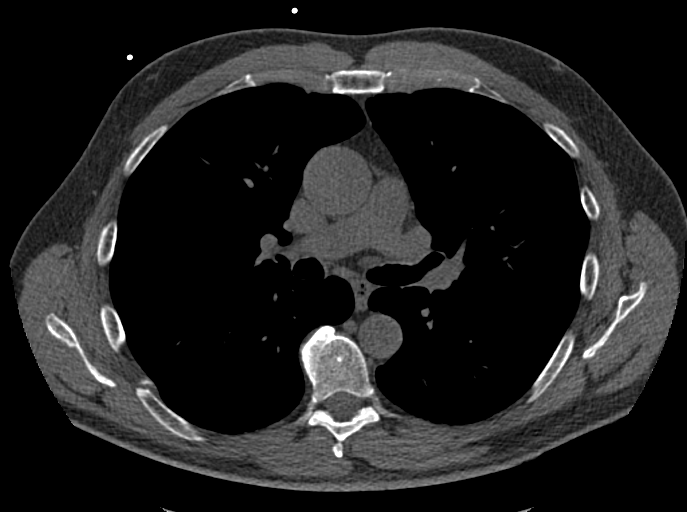
[im 61/73  lung]
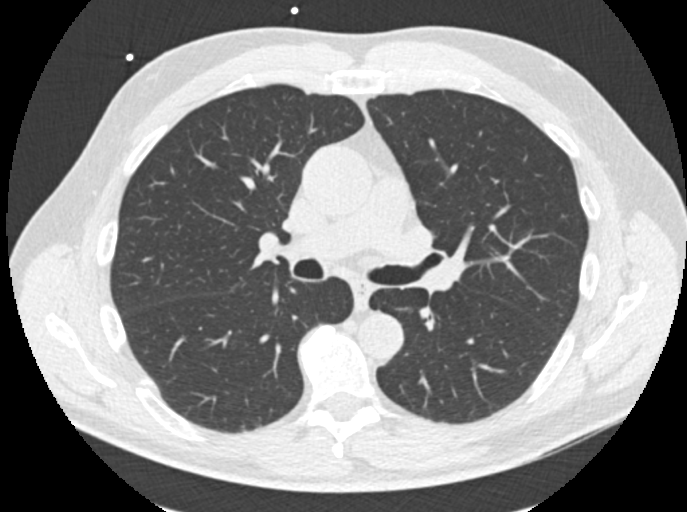

[Series 9: calcium scoring 2.00 br60 bestdiast 69% lungs · axial · 0.57mm/px · z∈[+1631,+1727]mm · 5 of 73 slices shown]
[im 13/73  vessel]
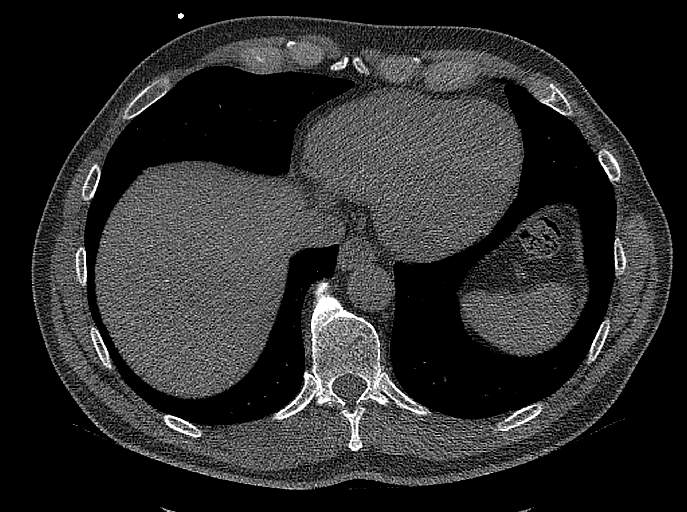
[im 25/73  vessel]
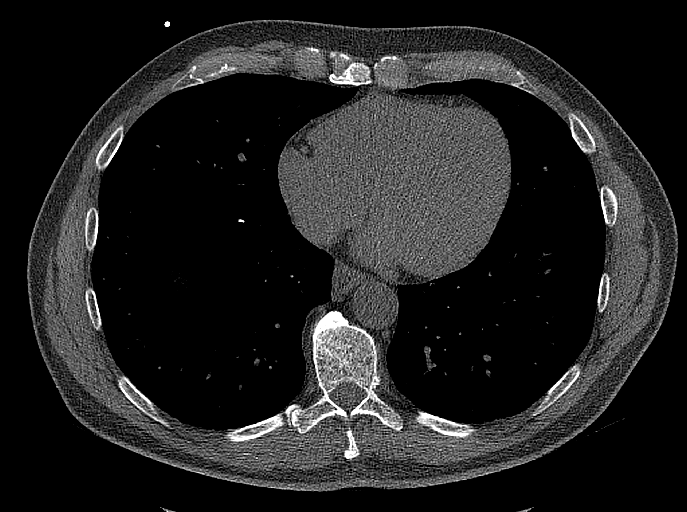
[im 37/73  vessel]
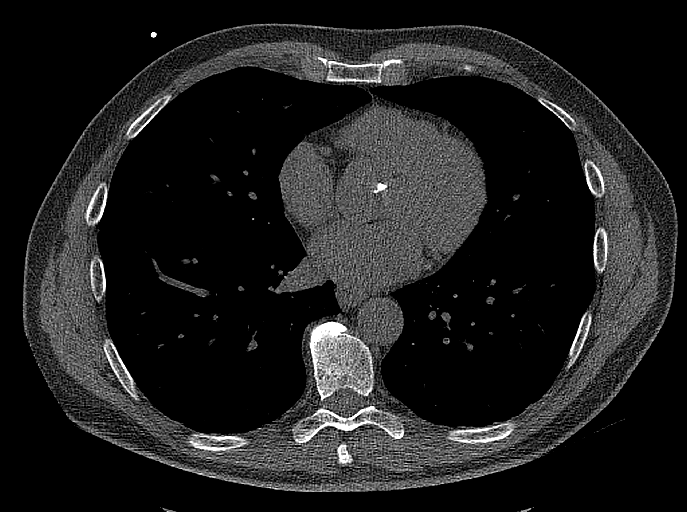
[im 49/73  vessel]
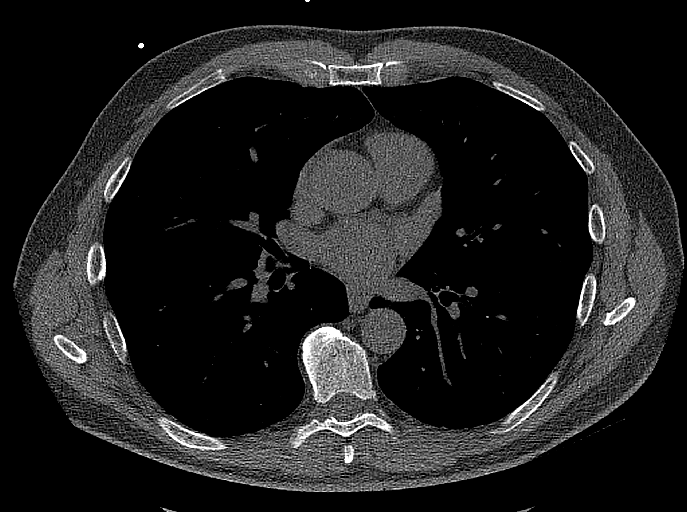
[im 61/73  vessel]
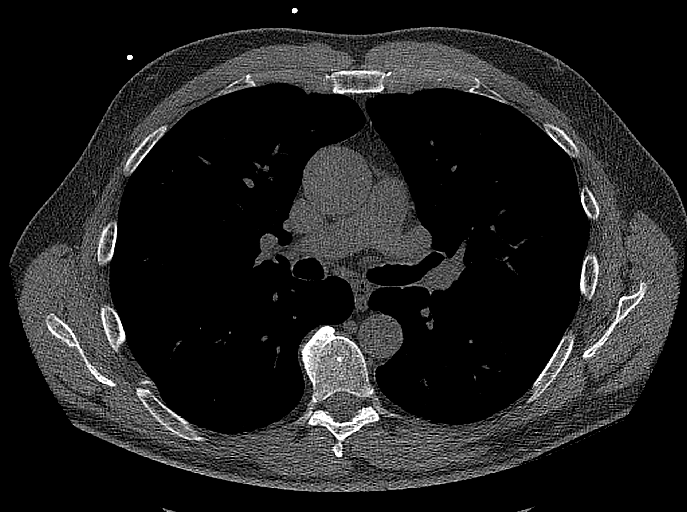

[13 of 20 positions shown; findings below may reference images not displayed]

FINDINGS: CORONARY CALCIUM SCORES:

Left Main:

LAD: 36

LCx: 14

RCA: 0

Total Agatston Score: 51

[HOSPITAL] percentile: 50

AORTA MEASUREMENTS:

Ascending Aorta: 36 mm

Descending Aorta: 25 mm

OTHER FINDINGS:

The heart size is within normal limits. The aortic valve is heavily
calcified consistent with degenerative disease. No pericardial fluid
identified. Visualized segments of the thoracic aorta and central
pulmonary arteries are normal in caliber. Visualized mediastinum and
hilar regions demonstrate no lymphadenopathy or masses. Visualized
lungs show no evidence of pulmonary edema, consolidation,
pneumothorax, nodule or pleural fluid. Visualized upper abdomen and
bony structures are unremarkable.
IMPRESSION: 1. Coronary calcium score of 51 is at the 50th percentile for the
patient's age, sex and race.
2. Heavily calcified aortic valve consistent with degenerative
disease. Correlation with echocardiography may be helpful to
evaluate valve morphology and function.

## 2022-02-11 ENCOUNTER — Other Ambulatory Visit: Payer: Self-pay | Admitting: Internal Medicine

## 2022-02-11 MED ORDER — ATORVASTATIN CALCIUM 40 MG PO TABS: 40.0000 mg | ORAL_TABLET | Freq: Every day | ORAL | 0 refills | Status: DC

## 2022-02-11 NOTE — Telephone Encounter (Signed)
Prescription sent to the pharmacy . Message left for patient on med bottle. Patient needs a annual appointment

## 2022-02-20 ENCOUNTER — Ambulatory Visit: Payer: BC Managed Care – PPO | Admitting: Dermatology

## 2022-07-31 DIAGNOSIS — Z8546 Personal history of malignant neoplasm of prostate: Secondary | ICD-10-CM | POA: Diagnosis not present

## 2022-08-14 DIAGNOSIS — Z8546 Personal history of malignant neoplasm of prostate: Secondary | ICD-10-CM | POA: Diagnosis not present

## 2022-08-14 DIAGNOSIS — N5201 Erectile dysfunction due to arterial insufficiency: Secondary | ICD-10-CM | POA: Diagnosis not present

## 2022-08-15 ENCOUNTER — Other Ambulatory Visit: Payer: Self-pay | Admitting: Internal Medicine

## 2022-09-11 DIAGNOSIS — Z8546 Personal history of malignant neoplasm of prostate: Secondary | ICD-10-CM | POA: Diagnosis not present

## 2022-11-15 DIAGNOSIS — H524 Presbyopia: Secondary | ICD-10-CM | POA: Diagnosis not present

## 2022-12-18 DIAGNOSIS — Z8546 Personal history of malignant neoplasm of prostate: Secondary | ICD-10-CM | POA: Diagnosis not present

## 2022-12-23 DIAGNOSIS — N5201 Erectile dysfunction due to arterial insufficiency: Secondary | ICD-10-CM | POA: Diagnosis not present

## 2022-12-23 DIAGNOSIS — Z8546 Personal history of malignant neoplasm of prostate: Secondary | ICD-10-CM | POA: Diagnosis not present

## 2022-12-25 DIAGNOSIS — K080 Exfoliation of teeth due to systemic causes: Secondary | ICD-10-CM | POA: Diagnosis not present

## 2023-02-03 DIAGNOSIS — E7800 Pure hypercholesterolemia, unspecified: Secondary | ICD-10-CM | POA: Diagnosis not present

## 2023-02-03 DIAGNOSIS — Z79899 Other long term (current) drug therapy: Secondary | ICD-10-CM | POA: Diagnosis not present

## 2023-02-11 DIAGNOSIS — E7800 Pure hypercholesterolemia, unspecified: Secondary | ICD-10-CM | POA: Diagnosis not present

## 2023-02-11 DIAGNOSIS — M25512 Pain in left shoulder: Secondary | ICD-10-CM | POA: Diagnosis not present

## 2023-02-11 DIAGNOSIS — Z0000 Encounter for general adult medical examination without abnormal findings: Secondary | ICD-10-CM | POA: Diagnosis not present

## 2023-02-11 DIAGNOSIS — R002 Palpitations: Secondary | ICD-10-CM | POA: Diagnosis not present

## 2023-02-11 DIAGNOSIS — N529 Male erectile dysfunction, unspecified: Secondary | ICD-10-CM | POA: Diagnosis not present

## 2023-02-13 DIAGNOSIS — M19012 Primary osteoarthritis, left shoulder: Secondary | ICD-10-CM | POA: Diagnosis not present

## 2023-02-13 DIAGNOSIS — M25512 Pain in left shoulder: Secondary | ICD-10-CM | POA: Diagnosis not present

## 2023-02-13 DIAGNOSIS — M25712 Osteophyte, left shoulder: Secondary | ICD-10-CM | POA: Diagnosis not present

## 2023-06-26 DIAGNOSIS — K080 Exfoliation of teeth due to systemic causes: Secondary | ICD-10-CM | POA: Diagnosis not present

## 2023-07-14 DIAGNOSIS — Z8546 Personal history of malignant neoplasm of prostate: Secondary | ICD-10-CM | POA: Diagnosis not present

## 2023-07-21 DIAGNOSIS — Z8546 Personal history of malignant neoplasm of prostate: Secondary | ICD-10-CM | POA: Diagnosis not present

## 2023-07-21 DIAGNOSIS — N5201 Erectile dysfunction due to arterial insufficiency: Secondary | ICD-10-CM | POA: Diagnosis not present

## 2023-07-21 DIAGNOSIS — R3912 Poor urinary stream: Secondary | ICD-10-CM | POA: Diagnosis not present

## 2023-07-24 ENCOUNTER — Other Ambulatory Visit (HOSPITAL_COMMUNITY): Payer: Self-pay | Admitting: Urology

## 2023-07-24 DIAGNOSIS — Z8546 Personal history of malignant neoplasm of prostate: Secondary | ICD-10-CM

## 2023-07-24 DIAGNOSIS — R9721 Rising PSA following treatment for malignant neoplasm of prostate: Secondary | ICD-10-CM

## 2023-08-04 ENCOUNTER — Encounter (HOSPITAL_COMMUNITY)
Admission: RE | Admit: 2023-08-04 | Discharge: 2023-08-04 | Disposition: A | Discharge status: Home / Self Care | Source: Ambulatory Visit | Attending: Urology | Admitting: Urology

## 2023-08-04 DIAGNOSIS — R9721 Rising PSA following treatment for malignant neoplasm of prostate: Secondary | ICD-10-CM | POA: Insufficient documentation

## 2023-08-04 DIAGNOSIS — C61 Malignant neoplasm of prostate: Secondary | ICD-10-CM | POA: Diagnosis not present

## 2023-08-04 DIAGNOSIS — Z8546 Personal history of malignant neoplasm of prostate: Secondary | ICD-10-CM | POA: Insufficient documentation

## 2023-08-04 MED ORDER — FLOTUFOLASTAT F 18 GALLIUM 296-5846 MBQ/ML IV SOLN
8.2510 | Freq: Once | INTRAVENOUS | Status: AC
  Administered 2023-08-04: 8.251 via INTRAVENOUS

## 2023-12-31 DIAGNOSIS — K080 Exfoliation of teeth due to systemic causes: Secondary | ICD-10-CM | POA: Diagnosis not present

## 2024-02-16 DIAGNOSIS — Z79899 Other long term (current) drug therapy: Secondary | ICD-10-CM | POA: Diagnosis not present

## 2024-02-16 DIAGNOSIS — E7800 Pure hypercholesterolemia, unspecified: Secondary | ICD-10-CM | POA: Diagnosis not present

## 2024-02-17 DIAGNOSIS — Z8546 Personal history of malignant neoplasm of prostate: Secondary | ICD-10-CM | POA: Diagnosis not present

## 2024-02-18 DIAGNOSIS — H5213 Myopia, bilateral: Secondary | ICD-10-CM | POA: Diagnosis not present

## 2024-02-24 DIAGNOSIS — R21 Rash and other nonspecific skin eruption: Secondary | ICD-10-CM | POA: Diagnosis not present

## 2024-02-24 DIAGNOSIS — N529 Male erectile dysfunction, unspecified: Secondary | ICD-10-CM | POA: Diagnosis not present

## 2024-02-24 DIAGNOSIS — E7800 Pure hypercholesterolemia, unspecified: Secondary | ICD-10-CM | POA: Diagnosis not present

## 2024-02-24 DIAGNOSIS — R002 Palpitations: Secondary | ICD-10-CM | POA: Diagnosis not present

## 2024-02-24 DIAGNOSIS — Z0000 Encounter for general adult medical examination without abnormal findings: Secondary | ICD-10-CM | POA: Diagnosis not present

## 2024-02-25 DIAGNOSIS — R9721 Rising PSA following treatment for malignant neoplasm of prostate: Secondary | ICD-10-CM | POA: Diagnosis not present

## 2024-02-25 DIAGNOSIS — N5201 Erectile dysfunction due to arterial insufficiency: Secondary | ICD-10-CM | POA: Diagnosis not present

## 2024-02-25 DIAGNOSIS — C61 Malignant neoplasm of prostate: Secondary | ICD-10-CM | POA: Diagnosis not present
